# Patient Record
Sex: Male | Born: 1995
Health system: Southern US, Community
[De-identification: ages and names within clinical notes are randomized; demographics above are authoritative.]

## PROBLEM LIST (undated history)

## (undated) HISTORY — PX: TONSILLECTOMY: SUR1361

## (undated) HISTORY — PX: WISDOM TOOTH EXTRACTION: SHX21

---
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
## 2015-08-20 ENCOUNTER — Emergency Department (HOSPITAL_COMMUNITY)
Admission: EM | Admit: 2015-08-20 | Discharge: 2015-08-20 | Disposition: A | Discharge status: Home / Self Care | Payer: Medicaid Other | Attending: Emergency Medicine | Admitting: Emergency Medicine

## 2015-08-20 ENCOUNTER — Encounter (HOSPITAL_COMMUNITY): Payer: Self-pay | Admitting: Emergency Medicine

## 2015-08-20 DIAGNOSIS — R5383 Other fatigue: Secondary | ICD-10-CM | POA: Insufficient documentation

## 2015-08-20 DIAGNOSIS — Z91040 Latex allergy status: Secondary | ICD-10-CM | POA: Insufficient documentation

## 2015-08-20 DIAGNOSIS — L03115 Cellulitis of right lower limb: Secondary | ICD-10-CM

## 2015-08-20 LAB — COMPREHENSIVE METABOLIC PANEL
ALT: 21 U/L (ref 17–63)
ANION GAP: 8 (ref 5–15)
AST: 22 U/L (ref 15–41)
Albumin: 4.6 g/dL (ref 3.5–5.0)
Alkaline Phosphatase: 68 U/L (ref 38–126)
BILIRUBIN TOTAL: 0.6 mg/dL (ref 0.3–1.2)
BUN: 12 mg/dL (ref 6–20)
CHLORIDE: 104 mmol/L (ref 101–111)
CO2: 29 mmol/L (ref 22–32)
Calcium: 9.6 mg/dL (ref 8.9–10.3)
Creatinine, Ser: 0.88 mg/dL (ref 0.61–1.24)
GFR calc Af Amer: >60 mL/min (ref >60)
Glucose, Bld: 95 mg/dL (ref 65–99)
POTASSIUM: 3.8 mmol/L (ref 3.5–5.1)
Sodium: 141 mmol/L (ref 135–145)
TOTAL PROTEIN: 7.5 g/dL (ref 6.5–8.1)

## 2015-08-20 LAB — CBC WITH DIFFERENTIAL/PLATELET
BASOS ABS: 0 10*3/uL (ref 0.0–0.1)
Basophils Relative: 0 %
EOS PCT: 3 %
Eosinophils Absolute: 0.2 10*3/uL (ref 0.0–0.7)
HEMATOCRIT: 40.6 % (ref 39.0–52.0)
Hemoglobin: 14.4 g/dL (ref 13.0–17.0)
LYMPHS PCT: 34 %
Lymphs Abs: 2.3 10*3/uL (ref 0.7–4.0)
MCH: 29.9 pg (ref 26.0–34.0)
MCHC: 35.5 g/dL (ref 30.0–36.0)
MCV: 84.2 fL (ref 78.0–100.0)
Monocytes Absolute: 0.5 10*3/uL (ref 0.1–1.0)
Monocytes Relative: 7 %
NEUTROS PCT: 56 %
Neutro Abs: 3.8 10*3/uL (ref 1.7–7.7)
PLATELETS: 167 10*3/uL (ref 150–400)
RBC: 4.82 MIL/uL (ref 4.22–5.81)
RDW: 12.4 % (ref 11.5–15.5)
WBC: 6.8 10*3/uL (ref 4.0–10.5)

## 2015-08-20 MED ORDER — DOXYCYCLINE HYCLATE 100 MG PO CAPS: 100.0000 mg | ORAL_CAPSULE | Freq: Two times a day (BID) | ORAL | Status: DC

## 2015-08-20 NOTE — ED Provider Notes (Signed)
CSN: [id number]     Arrival date & time 08/20/15  1549 History   First MD Initiated Contact with Patient 08/20/15 1600     Chief Complaint  Patient presents with  . Insect Bite    tick bite     (Consider location/radiation/quality/duration/timing/severity/associated sxs/prior Treatment) HPI... Allegedly tick bite on right inner thigh approximately one month ago resulting in skin rash followed by generalized fatigue, lethargy, low energy.  Patient and his girlfriend think he may have  Lyme disease.  No chronic health problems. No medications. No smoking no drinking.  History reviewed. No pertinent past medical history. Past Surgical History  Procedure Laterality Date  . Tonsillectomy    . Wisdom tooth extraction     No family history on file. Social History  Substance Use Topics  . Smoking status: Never Smoker   . Smokeless tobacco: None  . Alcohol Use: No    Review of Systems  All other systems reviewed and are negative.     Allergies  Latex  Home Medications   Prior to Admission medications   Medication Sig Start Date End Date Taking? Authorizing Provider  doxycycline (VIBRAMYCIN) 100 MG capsule Take 1 capsule (100 mg total) by mouth 2 (two) times daily. 08/20/15   Donnetta HutchingBrian Hema Lanza, MD   BP 121/56 mmHg  Pulse 65  Temp(Src) 98.4 F (36.9 C) (Oral)  Resp 18  Ht 5\' 10"  (1.778 m)  Wt 200 lb (90.719 kg)  BMI 28.70 kg/m2  SpO2 100% Physical Exam  Constitutional: He is oriented to person, place, and time. He appears well-developed and well-nourished.  HENT:  Head: Normocephalic and atraumatic.  Eyes: Conjunctivae and EOM are normal. Pupils are equal, round, and reactive to light.  Neck: Normal range of motion. Neck supple.  Cardiovascular: Normal rate and regular rhythm.   Pulmonary/Chest: Effort normal and breath sounds normal.  Abdominal: Soft. Bowel sounds are normal.  Musculoskeletal: Normal range of motion.  Neurological: He is alert and oriented to person, place,  and time.  Skin:  Small area of erythema right medial thigh  Psychiatric: He has a normal mood and affect. His behavior is normal.  Nursing note and vitals reviewed.   ED Course  Procedures (including critical care time) Labs Review Labs Reviewed  CBC WITH DIFFERENTIAL/PLATELET  COMPREHENSIVE METABOLIC PANEL  URINALYSIS, ROUTINE W REFLEX MICROSCOPIC (NOT AT [facility])  B. BURGDORFI ANTIBODIES    Imaging Review No results found. I have personally reviewed and evaluated these images and lab results as part of my medical decision-making.   EKG Interpretation None      MDM   Final diagnoses:  Cellulitis of right lower extremity    Girlfriend discussed what appeared to be a target lesion approximately 1 month ago. Will treat with doxycycline 100 mg twice a day. Lyme titer pending.    Donnetta HutchingBrian Khamya Topp, MD 08/20/15 [phone]

## 2015-08-20 NOTE — ED Notes (Signed)
Patient verbalizes understanding of discharge instructions, prescription medications, home care and follow up care. Patient ambulatory out of department at this time with family member. 

## 2015-08-20 NOTE — ED Notes (Signed)
Pt reports being bit by a tick to RT upper, inner thigh. Pt reports that it looked like a bullseye. Pt reports nausea, vomiting, and fever. States symptoms have become increasingly worse. Pt's girlfriend states he has been having dizzy spells. Pt AOx4

## 2015-08-20 NOTE — ED Notes (Signed)
Pt was given Ginger Ale 

## 2015-08-20 NOTE — Discharge Instructions (Signed)
Blood work was normal. Prescription for antibiotic. Lyme test pending

## 2015-08-21 LAB — B. BURGDORFI ANTIBODIES: B burgdorferi Ab IgG+IgM: <0.91 {ISR} (ref 0.00–0.90)

## 2019-06-09 ENCOUNTER — Other Ambulatory Visit: Payer: Self-pay

## 2019-06-09 DIAGNOSIS — Z20822 Contact with and (suspected) exposure to covid-19: Secondary | ICD-10-CM

## 2019-06-10 ENCOUNTER — Ambulatory Visit (INDEPENDENT_AMBULATORY_CARE_PROVIDER_SITE_OTHER): Payer: Self-pay | Admitting: Nurse Practitioner

## 2019-06-10 ENCOUNTER — Telehealth: Payer: Self-pay | Admitting: General Practice

## 2019-06-10 LAB — NOVEL CORONAVIRUS, NAA: SARS-CoV-2, NAA: NOT DETECTED

## 2019-06-10 NOTE — Telephone Encounter (Signed)
Negative COVID results given. Patient results "NOT Detected." Caller expressed understanding. ° °

## 2019-06-15 ENCOUNTER — Emergency Department (HOSPITAL_COMMUNITY): Payer: Self-pay

## 2019-06-15 ENCOUNTER — Emergency Department (HOSPITAL_COMMUNITY)
Admission: EM | Admit: 2019-06-15 | Discharge: 2019-06-15 | Disposition: A | Discharge status: Home / Self Care | Payer: Self-pay | Attending: Emergency Medicine | Admitting: Emergency Medicine

## 2019-06-15 DIAGNOSIS — Z91040 Latex allergy status: Secondary | ICD-10-CM | POA: Insufficient documentation

## 2019-06-15 DIAGNOSIS — N200 Calculus of kidney: Secondary | ICD-10-CM | POA: Insufficient documentation

## 2019-06-15 LAB — URINALYSIS, ROUTINE W REFLEX MICROSCOPIC
Bacteria, UA: NONE SEEN
Bilirubin Urine: NEGATIVE
Glucose, UA: NEGATIVE mg/dL
Ketones, ur: NEGATIVE mg/dL
Leukocytes,Ua: NEGATIVE
Nitrite: NEGATIVE
Protein, ur: 30 mg/dL — AB
RBC / HPF: >50 RBC/hpf — ABNORMAL HIGH (ref 0–5)
Specific Gravity, Urine: 1.028 (ref 1.005–1.030)
pH: 5 (ref 5.0–8.0)

## 2019-06-15 LAB — CBC WITH DIFFERENTIAL/PLATELET
Abs Immature Granulocytes: 0.02 10*3/uL (ref 0.00–0.07)
Basophils Absolute: 0.1 10*3/uL (ref 0.0–0.1)
Basophils Relative: 1 %
Eosinophils Absolute: 0.1 10*3/uL (ref 0.0–0.5)
Eosinophils Relative: 2 %
HCT: 44.1 % (ref 39.0–52.0)
Hemoglobin: 15.2 g/dL (ref 13.0–17.0)
Immature Granulocytes: 0 %
Lymphocytes Relative: 28 %
Lymphs Abs: 2 10*3/uL (ref 0.7–4.0)
MCH: 29.7 pg (ref 26.0–34.0)
MCHC: 34.5 g/dL (ref 30.0–36.0)
MCV: 86.3 fL (ref 80.0–100.0)
Monocytes Absolute: 0.6 10*3/uL (ref 0.1–1.0)
Monocytes Relative: 8 %
Neutro Abs: 4.3 10*3/uL (ref 1.7–7.7)
Neutrophils Relative %: 61 %
Platelets: 204 10*3/uL (ref 150–400)
RBC: 5.11 MIL/uL (ref 4.22–5.81)
RDW: 12.2 % (ref 11.5–15.5)
WBC: 7 10*3/uL (ref 4.0–10.5)
nRBC: 0 % (ref 0.0–0.2)

## 2019-06-15 LAB — COMPREHENSIVE METABOLIC PANEL
ALT: 43 U/L (ref 0–44)
AST: 28 U/L (ref 15–41)
Albumin: 4.7 g/dL (ref 3.5–5.0)
Alkaline Phosphatase: 55 U/L (ref 38–126)
Anion gap: 13 (ref 5–15)
BUN: 13 mg/dL (ref 6–20)
CO2: 27 mmol/L (ref 22–32)
Calcium: 9.5 mg/dL (ref 8.9–10.3)
Chloride: 100 mmol/L (ref 98–111)
Creatinine, Ser: 1.12 mg/dL (ref 0.61–1.24)
GFR calc Af Amer: >60 mL/min (ref >60)
GFR calc non Af Amer: >60 mL/min (ref >60)
Glucose, Bld: 114 mg/dL — ABNORMAL HIGH (ref 70–99)
Potassium: 3.7 mmol/L (ref 3.5–5.1)
Sodium: 140 mmol/L (ref 135–145)
Total Bilirubin: 0.6 mg/dL (ref 0.3–1.2)
Total Protein: 7.5 g/dL (ref 6.5–8.1)

## 2019-06-15 LAB — LIPASE, BLOOD: Lipase: 33 U/L (ref 11–51)

## 2019-06-15 MED ORDER — HYDROCODONE-ACETAMINOPHEN 5-325 MG PO TABS: 1.0000 | ORAL_TABLET | Freq: Four times a day (QID) | ORAL | 0 refills | Status: AC | PRN

## 2019-06-15 MED ORDER — KETOROLAC TROMETHAMINE 30 MG/ML IJ SOLN
30.0000 mg | Freq: Once | INTRAMUSCULAR | Status: AC
  Administered 2019-06-15: 14:00:00 30 mg via INTRAVENOUS
  Filled 2019-06-15: qty 1

## 2019-06-15 MED ORDER — KETOROLAC TROMETHAMINE 30 MG/ML IJ SOLN
15.0000 mg | Freq: Once | INTRAMUSCULAR | Status: AC
  Administered 2019-06-15: 17:00:00 15 mg via INTRAVENOUS
  Filled 2019-06-15: qty 1

## 2019-06-15 MED ORDER — ONDANSETRON 4 MG PO TBDP: 4.0000 mg | ORAL_TABLET | Freq: Three times a day (TID) | ORAL | 0 refills | Status: AC | PRN

## 2019-06-15 MED ORDER — ONDANSETRON HCL 4 MG/2ML IJ SOLN
4.0000 mg | Freq: Once | INTRAMUSCULAR | Status: AC
  Administered 2019-06-15: 15:00:00 4 mg via INTRAVENOUS
  Filled 2019-06-15: qty 2

## 2019-06-15 MED ORDER — TAMSULOSIN HCL 0.4 MG PO CAPS: 0.4000 mg | ORAL_CAPSULE | Freq: Every day | ORAL | 0 refills | Status: AC

## 2019-06-15 MED ORDER — SODIUM CHLORIDE 0.9 % IV BOLUS
1000.0000 mL | Freq: Once | INTRAVENOUS | Status: AC
  Administered 2019-06-15: 1000 mL via INTRAVENOUS

## 2019-06-15 NOTE — ED Provider Notes (Signed)
[facility] EMERGENCY DEPARTMENT Provider Note   CSN: [id number] Arrival date & time: 06/15/19  1332     History   Chief Complaint Chief Complaint  Patient presents with   Abdominal Pain    HPI Robert Rios is a 23 y.o. male.     The history is provided by the patient.    No past medical history on file.  Brought in by EMS for evaluation of right-sided abdominal pain.  He states that he went to [place] to go get lunch and all of a sudden developed right lower abdominal pain.  He describes it as sharp and states that it is 10/10.  He had an episode of nonbloody, nonbilious vomiting in route.  He states that he was in his normal state state of health today prior to onset of symptoms.  He ate breakfast without any difficulty and has not been sick over the last several days.  He has not noted any fevers, chest pain, difficulty breathing.  He states a couple weeks ago, he did have some blood in his urine.  He did a telehealth visit and they diagnosed him with Salmonella.  Patient states he has not had any more hematuria or dysuria.   Patient Active Problem List   Diagnosis Date Noted   SPONDYLOLYSIS 03/25/2008   LOW BACK PAIN 03/11/2008   LUMBAR SPASM 03/11/2008   SUBLUXATION-RADIAL HEAD 03/11/2008    Past Surgical History:  Procedure Laterality Date   TONSILLECTOMY     WISDOM TOOTH EXTRACTION          Home Medications    Prior to Admission medications   Medication Sig Start Date End Date Taking? Authorizing Provider  doxycycline (VIBRAMYCIN) 100 MG capsule Take 1 capsule (100 mg total) by mouth 2 (two) times daily. 08/20/15   Nat Christen, MD  HYDROcodone-acetaminophen (NORCO/VICODIN) 5-325 MG tablet Take 1-2 tablets by mouth every 6 (six) hours as needed. 06/15/19   Volanda Napoleon, PA-C  ondansetron (ZOFRAN ODT) 4 MG disintegrating tablet Take 1 tablet (4 mg total) by mouth every 8 (eight) hours as needed for nausea or vomiting. 06/15/19   Volanda Napoleon, PA-C  tamsulosin (FLOMAX) 0.4 MG CAPS capsule Take 1 capsule (0.4 mg total) by mouth daily. 06/15/19   Volanda Napoleon, PA-C    Family History No family history on file.  Social History Social History   Tobacco Use   Smoking status: Never Smoker  Substance Use Topics   Alcohol use: No   Drug use: No     Allergies   Latex   Review of Systems Review of Systems  Constitutional: Negative for fever.  Respiratory: Negative for cough and shortness of breath.   Cardiovascular: Negative for chest pain.  Gastrointestinal: Positive for abdominal pain, nausea and vomiting.  Genitourinary: Negative for dysuria and hematuria.  Neurological: Negative for headaches.  All other systems reviewed and are negative.    Physical Exam Updated Vital Signs BP 118/76    Pulse 76    Temp 98 F (36.7 C)    Resp 18    Ht 5\' 11"  (1.803 m)    Wt 104.3 kg    SpO2 100%    BMI 32.08 kg/m   Physical Exam Vitals signs and nursing note reviewed.  Constitutional:      Appearance: Normal appearance. He is well-developed.     Comments: Appears uncomfortable but no acute distress  HENT:     Head: Normocephalic and atraumatic.  Eyes:     General: Lids are normal.     Conjunctiva/sclera: Conjunctivae normal.     Pupils: Pupils are equal, round, and reactive to light.  Neck:     Musculoskeletal: Full passive range of motion without pain.  Cardiovascular:     Rate and Rhythm: Normal rate and regular rhythm.     Pulses: Normal pulses.     Heart sounds: Normal heart sounds. No murmur. No friction rub. No gallop.   Pulmonary:     Effort: Pulmonary effort is normal.     Breath sounds: Normal breath sounds.     Comments: Lungs clear to auscultation bilaterally.  Symmetric chest rise.  No wheezing, rales, rhonchi. Abdominal:     Palpations: Abdomen is soft. Abdomen is not rigid.     Tenderness: There is no abdominal tenderness. There is no guarding.       Comments: Abdomen is soft,  nondistended.  Diffuse tenderness noted to the right lower quadrant and mid right lower abdomen.  He does have some tenderness noted at McBurney's point.  No CVA tenderness.  No rigidity, guarding.  Musculoskeletal: Normal range of motion.  Skin:    General: Skin is warm and dry.     Capillary Refill: Capillary refill takes less than 2 seconds.  Neurological:     Mental Status: He is alert and oriented to person, place, and time.  Psychiatric:        Speech: Speech normal.      ED Treatments / Results  Labs (all labs ordered are listed, but only abnormal results are displayed) Labs Reviewed  COMPREHENSIVE METABOLIC PANEL - Abnormal; Notable for the following components:      Result Value   Glucose, Bld 114 (*)    All other components within normal limits  URINALYSIS, ROUTINE W REFLEX MICROSCOPIC - Abnormal; Notable for the following components:   APPearance CLOUDY (*)    Hgb urine dipstick LARGE (*)    Protein, ur 30 (*)    RBC / HPF >50 (*)    All other components within normal limits  CBC WITH DIFFERENTIAL/PLATELET  LIPASE, BLOOD    EKG None  Radiology Ct Renal Stone Study  Result Date: 06/15/2019 CLINICAL DATA:  Right lower quadrant pain. EXAM: CT ABDOMEN AND PELVIS WITHOUT CONTRAST TECHNIQUE: Multidetector CT imaging of the abdomen and pelvis was performed following the standard protocol without IV contrast. COMPARISON:  None. FINDINGS: Lower chest: No acute abnormality. Hepatobiliary: No focal liver abnormality is seen. No gallstones, gallbladder wall thickening, or biliary dilatation. Pancreas: Unremarkable. No pancreatic ductal dilatation or surrounding inflammatory changes. Spleen: Normal in size without focal abnormality. Adrenals/Urinary Tract: The left adrenal gland is normal. There is rounded calcification in the right adrenal gland measuring up to 13 mm. Right adrenal glands otherwise normal. No renal stones identified. The left kidney and ureter are normal. There is  mild hydronephrosis on the left and mild right ureterectasis due to a stone in the distal right ureter measuring 3.3 mm. The bladder is decompressed but unremarkable. Stomach/Bowel: Stomach is within normal limits. Appendix appears normal. No evidence of bowel wall thickening, distention, or inflammatory changes. Vascular/Lymphatic: No significant vascular findings are present. No enlarged abdominal or pelvic lymph nodes. Reproductive: Prostate is unremarkable. Other: No abdominal wall hernia or abnormality. No abdominopelvic ascites. Musculoskeletal: No acute or significant osseous findings. IMPRESSION: 1. Mild right hydronephrosis and ureterectasis due to a 3.3 mm stone in the distal right ureter. 2. Rounded calcification in the right adrenal  gland is likely a site of previous hemorrhage or infection and of doubtful significance. 3. No other abnormalities. Electronically Signed   By: Gerome Samavid  Williams III M.D   On: 06/15/2019 16:23    Procedures Procedures (including critical care time)  Medications Ordered in ED Medications  ketorolac (TORADOL) 30 MG/ML injection 30 mg (30 mg Intravenous Given 06/15/19 1401)  ondansetron (ZOFRAN) injection 4 mg (4 mg Intravenous Given 06/15/19 1445)  sodium chloride 0.9 % bolus 1,000 mL (0 mLs Intravenous Stopped 06/15/19 1741)  ketorolac (TORADOL) 30 MG/ML injection 15 mg (15 mg Intravenous Given 06/15/19 1640)     Initial Impression / Assessment and Plan / ED Course  I have reviewed the triage vital signs and the nursing notes.  Pertinent labs & imaging results that were available during my care of the patient were reviewed by me and considered in my medical decision making (see chart for details).        23 year old male who presents for evaluation of acute onset right lower quadrant abdominal pain.  Associated with vomiting.  No recent fevers, nausea.  No chest pain, difficulty breathing.  On initial ED arrival, he is afebrile.  He appears uncomfortable but  no acute distress.  Vitals otherwise stable.  Does have diffuse tenderness noted to right lower and mid abdomen but does have some tenderness in this point.  Consider kidney stone versus appendicitis.  CBC without any significant leukocytosis or anemia.  Lipase is within normal limits.  CMP is unremarkable. UA shows large hemoglobin. Given findings, will proceed with renal study.   CT scan shows a 3 mm kidney stone noted in the right side.  Mild right hydronephrosis associated.  Also rounded calcification right adrenal gland that is doubtful significance.  Discussed results with patient.  Patient reports feeling better after analgesics.  Vitals are stable.  Repeat abdominal exam shows improved tenderness.  Patient states he is ready to go home. At this time, patient exhibits no emergent life-threatening condition that require further evaluation in ED or admission. Patient had ample opportunity for questions and discussion. All patient's questions were answered with full understanding. Strict return precautions discussed. Patient expresses understanding and agreement to plan.   Portions of this note were generated with Scientist, clinical (histocompatibility and immunogenetics)Dragon dictation software. Dictation errors may occur despite best attempts at proofreading.   Final Clinical Impressions(s) / ED Diagnoses   Final diagnoses:  Kidney stone on right side    ED Discharge Orders         Ordered    HYDROcodone-acetaminophen (NORCO/VICODIN) 5-325 MG tablet  Every 6 hours PRN     06/15/19 1740    ondansetron (ZOFRAN ODT) 4 MG disintegrating tablet  Every 8 hours PRN     06/15/19 1740    tamsulosin (FLOMAX) 0.4 MG CAPS capsule  Daily     06/15/19 1740           Rosana HoesLayden, Kellen Hover A, PA-C 06/15/19 1934    Tegeler, Canary Brimhristopher J, MD 06/15/19 2002

## 2019-06-15 NOTE — ED Triage Notes (Addendum)
Pt BIB [facility] from work. Per EMS patient was at work about to eat lunch when he developed sudden onset right, lower quadrant pain. 10/10 sharp pain. Reports patient tried to go to the restroom but was unable to. Pt has had one episode of vomiting since the pain onset. Pt reports feeling fine prior to his onset of pain.

## 2019-06-15 NOTE — Discharge Instructions (Signed)
You can take Tylenol or Ibuprofen as directed for pain. You can alternate Tylenol and Ibuprofen every 4 hours. If you take Tylenol at 1pm, then you can take Ibuprofen at 5pm. Then you can take Tylenol again at 9pm.   Take pain medications as directed for break through pain. Do not drive or operate machinery while taking this medication.   Take Flomax as directed.  Take Zofran for nausea.  As we discussed, follow-up with referred to urology clinic.  Return the emergency department for any worsening pain not controlled by medication, vomiting, fevers or any other worsening concerning symptoms.

## 2019-06-25 DIAGNOSIS — F438 Other reactions to severe stress: Secondary | ICD-10-CM | POA: Diagnosis not present

## 2019-06-30 ENCOUNTER — Ambulatory Visit (HOSPITAL_COMMUNITY): Payer: Self-pay

## 2019-06-30 ENCOUNTER — Other Ambulatory Visit: Payer: Self-pay

## 2019-06-30 ENCOUNTER — Other Ambulatory Visit: Payer: Self-pay | Admitting: Urology

## 2019-06-30 ENCOUNTER — Ambulatory Visit (HOSPITAL_COMMUNITY): Payer: Self-pay | Admitting: Certified Registered"

## 2019-06-30 ENCOUNTER — Ambulatory Visit (HOSPITAL_COMMUNITY)
Admission: RE | Admit: 2019-06-30 | Discharge: 2019-06-30 | Disposition: A | Discharge status: Home / Self Care | Payer: Self-pay | Source: Other Acute Inpatient Hospital | Attending: Urology | Admitting: Urology

## 2019-06-30 ENCOUNTER — Encounter (HOSPITAL_COMMUNITY)
Admission: RE | Disposition: A | Discharge status: Home / Self Care | Payer: Self-pay | Source: Other Acute Inpatient Hospital | Attending: Urology

## 2019-06-30 ENCOUNTER — Other Ambulatory Visit (HOSPITAL_COMMUNITY)
Admission: RE | Admit: 2019-06-30 | Discharge: 2019-06-30 | Disposition: A | Discharge status: Home / Self Care | Payer: HRSA Program | Source: Ambulatory Visit | Attending: Urology | Admitting: Urology

## 2019-06-30 ENCOUNTER — Encounter (HOSPITAL_COMMUNITY): Payer: Self-pay | Admitting: Anesthesiology

## 2019-06-30 DIAGNOSIS — Z20828 Contact with and (suspected) exposure to other viral communicable diseases: Secondary | ICD-10-CM | POA: Insufficient documentation

## 2019-06-30 DIAGNOSIS — Z79899 Other long term (current) drug therapy: Secondary | ICD-10-CM | POA: Insufficient documentation

## 2019-06-30 DIAGNOSIS — N201 Calculus of ureter: Secondary | ICD-10-CM | POA: Diagnosis not present

## 2019-06-30 DIAGNOSIS — N135 Crossing vessel and stricture of ureter without hydronephrosis: Secondary | ICD-10-CM | POA: Diagnosis not present

## 2019-06-30 DIAGNOSIS — N132 Hydronephrosis with renal and ureteral calculous obstruction: Secondary | ICD-10-CM | POA: Insufficient documentation

## 2019-06-30 DIAGNOSIS — Z01812 Encounter for preprocedural laboratory examination: Secondary | ICD-10-CM | POA: Diagnosis not present

## 2019-06-30 DIAGNOSIS — N131 Hydronephrosis with ureteral stricture, not elsewhere classified: Secondary | ICD-10-CM | POA: Insufficient documentation

## 2019-06-30 DIAGNOSIS — F419 Anxiety disorder, unspecified: Secondary | ICD-10-CM | POA: Insufficient documentation

## 2019-06-30 DIAGNOSIS — F329 Major depressive disorder, single episode, unspecified: Secondary | ICD-10-CM | POA: Insufficient documentation

## 2019-06-30 HISTORY — PX: CYSTOSCOPY WITH RETROGRADE PYELOGRAM, URETEROSCOPY AND STENT PLACEMENT: SHX5789

## 2019-06-30 LAB — SARS CORONAVIRUS 2 BY RT PCR (HOSPITAL ORDER, PERFORMED IN ~~LOC~~ HOSPITAL LAB): SARS Coronavirus 2: NEGATIVE

## 2019-06-30 SURGERY — CYSTOURETEROSCOPY, WITH RETROGRADE PYELOGRAM AND STENT INSERTION
Anesthesia: General | Laterality: Right

## 2019-06-30 MED ORDER — HYDROMORPHONE HCL 1 MG/ML IJ SOLN: 0.2500 mg | INTRAMUSCULAR | Status: DC | PRN

## 2019-06-30 MED ORDER — SODIUM CHLORIDE 0.9 % IV SOLN: 250.0000 mL | INTRAVENOUS | Status: DC | PRN

## 2019-06-30 MED ORDER — ONDANSETRON HCL 4 MG/2ML IJ SOLN
INTRAMUSCULAR | Status: AC
  Filled 2019-06-30: qty 2

## 2019-06-30 MED ORDER — LACTATED RINGERS IV SOLN
INTRAVENOUS | Status: DC
  Administered 2019-06-30: 15:00:00 via INTRAVENOUS

## 2019-06-30 MED ORDER — CEFAZOLIN SODIUM-DEXTROSE 2-4 GM/100ML-% IV SOLN
2.0000 g | INTRAVENOUS | Status: AC
  Administered 2019-06-30: 17:00:00 2 g via INTRAVENOUS
  Filled 2019-06-30: qty 100

## 2019-06-30 MED ORDER — SODIUM CHLORIDE 0.9 % IR SOLN
Status: DC | PRN
  Administered 2019-06-30: 3000 mL via INTRAVESICAL

## 2019-06-30 MED ORDER — ONDANSETRON HCL 4 MG/2ML IJ SOLN
INTRAMUSCULAR | Status: DC | PRN
  Administered 2019-06-30: 4 mg via INTRAVENOUS

## 2019-06-30 MED ORDER — KETOROLAC TROMETHAMINE 15 MG/ML IJ SOLN
INTRAMUSCULAR | Status: DC | PRN
  Administered 2019-06-30: 15 mg via INTRAVENOUS

## 2019-06-30 MED ORDER — MEPERIDINE HCL 50 MG/ML IJ SOLN: 6.2500 mg | INTRAMUSCULAR | Status: DC | PRN

## 2019-06-30 MED ORDER — FENTANYL CITRATE (PF) 100 MCG/2ML IJ SOLN
INTRAMUSCULAR | Status: DC | PRN
  Administered 2019-06-30 (×2): 50 ug via INTRAVENOUS

## 2019-06-30 MED ORDER — PHENAZOPYRIDINE HCL 200 MG PO TABS: 200.0000 mg | ORAL_TABLET | Freq: Three times a day (TID) | ORAL | 0 refills | Status: AC | PRN

## 2019-06-30 MED ORDER — OXYCODONE HCL 5 MG PO TABS: 5.0000 mg | ORAL_TABLET | ORAL | Status: DC | PRN

## 2019-06-30 MED ORDER — MORPHINE SULFATE (PF) 4 MG/ML IV SOLN: 2.0000 mg | INTRAVENOUS | Status: DC | PRN

## 2019-06-30 MED ORDER — PROPOFOL 10 MG/ML IV BOLUS
INTRAVENOUS | Status: AC
  Filled 2019-06-30: qty 20

## 2019-06-30 MED ORDER — PROMETHAZINE HCL 25 MG/ML IJ SOLN: 6.2500 mg | INTRAMUSCULAR | Status: DC | PRN

## 2019-06-30 MED ORDER — MIDAZOLAM HCL 2 MG/2ML IJ SOLN
INTRAMUSCULAR | Status: AC
  Filled 2019-06-30: qty 2

## 2019-06-30 MED ORDER — SODIUM CHLORIDE 0.9% FLUSH: 3.0000 mL | INTRAVENOUS | Status: DC | PRN

## 2019-06-30 MED ORDER — LIDOCAINE 2% (20 MG/ML) 5 ML SYRINGE
INTRAMUSCULAR | Status: AC
  Filled 2019-06-30: qty 5

## 2019-06-30 MED ORDER — LIDOCAINE 2% (20 MG/ML) 5 ML SYRINGE
INTRAMUSCULAR | Status: DC | PRN
  Administered 2019-06-30: 100 mg via INTRAVENOUS

## 2019-06-30 MED ORDER — ACETAMINOPHEN 650 MG RE SUPP
650.0000 mg | RECTAL | Status: DC | PRN
  Filled 2019-06-30: qty 1

## 2019-06-30 MED ORDER — ACETAMINOPHEN 325 MG PO TABS: 650.0000 mg | ORAL_TABLET | ORAL | Status: DC | PRN

## 2019-06-30 MED ORDER — PROPOFOL 10 MG/ML IV BOLUS
INTRAVENOUS | Status: DC | PRN
  Administered 2019-06-30: 200 mg via INTRAVENOUS

## 2019-06-30 MED ORDER — DEXAMETHASONE SODIUM PHOSPHATE 10 MG/ML IJ SOLN
INTRAMUSCULAR | Status: DC | PRN
  Administered 2019-06-30: 10 mg via INTRAVENOUS

## 2019-06-30 MED ORDER — FENTANYL CITRATE (PF) 100 MCG/2ML IJ SOLN
INTRAMUSCULAR | Status: AC
  Filled 2019-06-30: qty 2

## 2019-06-30 MED ORDER — MIDAZOLAM HCL 5 MG/5ML IJ SOLN
INTRAMUSCULAR | Status: DC | PRN
  Administered 2019-06-30: 2 mg via INTRAVENOUS

## 2019-06-30 SURGICAL SUPPLY — 26 items
BAG URO CATCHER STRL LF (MISCELLANEOUS) ×3 IMPLANT
BASKET STONE NCOMPASS (UROLOGICAL SUPPLIES) IMPLANT
CATH URET 5FR 28IN OPEN ENDED (CATHETERS) IMPLANT
CATH URET DUAL LUMEN 6-10FR 50 (CATHETERS) ×1 IMPLANT
CLOTH BEACON ORANGE TIMEOUT ST (SAFETY) ×3 IMPLANT
COVER WAND RF STERILE (DRAPES) IMPLANT
EXTRACTOR STONE NITINOL NGAGE (UROLOGICAL SUPPLIES) ×3 IMPLANT
FIBER LASER FLEXIVA 1000 (UROLOGICAL SUPPLIES) IMPLANT
FIBER LASER FLEXIVA 365 (UROLOGICAL SUPPLIES) IMPLANT
FIBER LASER FLEXIVA 550 (UROLOGICAL SUPPLIES) IMPLANT
FIBER LASER TRAC TIP (UROLOGICAL SUPPLIES) IMPLANT
GLOVE SURG SS PI 8.0 STRL IVOR (GLOVE) IMPLANT
GOWN STRL REUS W/TWL XL LVL3 (GOWN DISPOSABLE) ×5 IMPLANT
GUIDEWIRE STR DUAL SENSOR (WIRE) ×3 IMPLANT
IV NS 1000ML (IV SOLUTION) ×3
IV NS 1000ML BAXH (IV SOLUTION) ×1 IMPLANT
KIT BALLN UROMAX 15FX4 (MISCELLANEOUS) IMPLANT
KIT BALLN UROMAX 26 75X4 (MISCELLANEOUS) ×2
KIT TURNOVER KIT A (KITS) ×2 IMPLANT
MANIFOLD NEPTUNE II (INSTRUMENTS) ×3 IMPLANT
PACK CYSTO (CUSTOM PROCEDURE TRAY) ×3 IMPLANT
SHEATH URETERAL 12FRX35CM (MISCELLANEOUS) ×1 IMPLANT
STENT CONTOUR 6FRX26X.038 (STENTS) ×2 IMPLANT
TUBING CONNECTING 10 (TUBING) ×2 IMPLANT
TUBING CONNECTING 10' (TUBING) ×1
TUBING UROLOGY SET (TUBING) ×3 IMPLANT

## 2019-06-30 NOTE — Anesthesia Preprocedure Evaluation (Signed)
Anesthesia Evaluation  Patient identified by MRN, date of birth, ID band Patient awake    Reviewed: Allergy & Precautions, NPO status , Patient's Chart, lab work & pertinent test results  Airway Mallampati: II  TM Distance: >3 FB Neck ROM: Full    Dental no notable dental hx. (+) Teeth Intact   Pulmonary neg pulmonary ROS,    Pulmonary exam normal breath sounds clear to auscultation       Cardiovascular negative cardio ROS Normal cardiovascular exam Rhythm:Regular Rate:Normal     Neuro/Psych negative neurological ROS  negative psych ROS   GI/Hepatic negative GI ROS, Neg liver ROS,   Endo/Other  negative endocrine ROS  Renal/GU Right distal ureteral calculus  negative genitourinary   Musculoskeletal negative musculoskeletal ROS (+)   Abdominal   Peds  Hematology negative hematology ROS (+)   Anesthesia Other Findings   Reproductive/Obstetrics                             Anesthesia Physical Anesthesia Plan  ASA: II  Anesthesia Plan: General   Post-op Pain Management:    Induction: Intravenous  PONV Risk Score and Plan: 4 or greater and Midazolam, Ondansetron, Dexamethasone and Treatment may vary due to age or medical condition  Airway Management Planned: LMA  Additional Equipment:   Intra-op Plan:   Post-operative Plan: Extubation in OR  Informed Consent: I have reviewed the patients History and Physical, chart, labs and discussed the procedure including the risks, benefits and alternatives for the proposed anesthesia with the patient or authorized representative who has indicated his/her understanding and acceptance.     Dental advisory given  Plan Discussed with: CRNA and Surgeon  Anesthesia Plan Comments:         Anesthesia Quick Evaluation

## 2019-06-30 NOTE — Transfer of Care (Signed)
Immediate Anesthesia Transfer of Care Note  Patient: Robert Rios  Procedure(s) Performed: CYSTOSCOPY WITH RETROGRADE PYELOGRAM,RIGHT URETEROSCOPY, STENT PLACEMENT, BASKET STONE REMOVAL (Right )  Patient Location: PACU  Anesthesia Type:General  Level of Consciousness: awake, alert  and patient cooperative  Airway & Oxygen Therapy: Patient Spontanous Breathing and Patient connected to face mask oxygen  Post-op Assessment: Report given to RN and Post -op Vital signs reviewed and stable  Post vital signs: Reviewed and stable  Last Vitals:  Vitals Value Taken Time  BP 135/73 06/30/19 1716  Temp    Pulse 93 06/30/19 1718  Resp 20 06/30/19 1718  SpO2 100 % 06/30/19 1718  Vitals shown include unvalidated device data.  Last Pain:  Vitals:   06/30/19 1506  TempSrc:   PainSc: 1       Patients Stated Pain Goal: 3 (37/09/64 3838)  Complications: No apparent anesthesia complications

## 2019-06-30 NOTE — Anesthesia Postprocedure Evaluation (Signed)
Anesthesia Post Note  Patient: [name]  Procedure(s) Performed: CYSTOSCOPY WITH RETROGRADE PYELOGRAM,RIGHT URETEROSCOPY, STENT PLACEMENT, BASKET STONE REMOVAL (Right )     Patient location during evaluation: PACU Anesthesia Type: General Level of consciousness: awake and alert and oriented Pain management: pain level controlled Vital Signs Assessment: post-procedure vital signs reviewed and stable Respiratory status: spontaneous breathing, nonlabored ventilation and respiratory function stable Cardiovascular status: blood pressure returned to baseline and stable Postop Assessment: no apparent nausea or vomiting Anesthetic complications: no    Last Vitals:  Vitals:   06/30/19 1730 06/30/19 1745  BP: 136/82 138/87  Pulse: 80 80  Resp: 16 14  Temp:    SpO2: 100% 100%    Last Pain:  Vitals:   06/30/19 1745  TempSrc:   PainSc: 0-No pain                 Kaiesha Tonner A.

## 2019-06-30 NOTE — Anesthesia Procedure Notes (Signed)
Procedure Name: LMA Insertion Date/Time: 06/30/2019 4:32 PM Performed by: West Pugh, CRNA Pre-anesthesia Checklist: Patient identified, Emergency Drugs available, Suction available, Patient being monitored and Timeout performed Patient Re-evaluated:Patient Re-evaluated prior to induction Oxygen Delivery Method: Circle system utilized Preoxygenation: Pre-oxygenation with 100% oxygen Induction Type: IV induction LMA: LMA with gastric port inserted LMA Size: 4.0 Number of attempts: 1 Tube secured with: Tape Dental Injury: Teeth and Oropharynx as per pre-operative assessment

## 2019-06-30 NOTE — Op Note (Signed)
Procedure: 1.  Cystoscopy with right retrograde pyelogram and interpretation. 2.  Cystoscopy with balloon dilation of right distal ureteral stricture. 3.  Right ureteroscopic stone extraction. 4.  Cystoscopy with insertion of right double-J stent.  Preop diagnosis: 3 mm right distal ureteral stone.  Postop diagnosis: 1.  3 mm right distal ureteral stone. 2.  Right distal ureteral stricture.  Surgeon: Dr. Irine Seal.  Anesthesia: General.  Specimen: Stone.  Drain: 6 [place] by 26 cm right contour double-J stent with tether.  EBL: Minimal.  Complications: None.  Indications: Robert Rios is a 23 year old male who has a 3 mm right distal ureteral stone with persistent pain and nonprogression.  He is elected ureteroscopy.  Procedure: He was given 2 g of Ancef.  He was taken operating room where general anesthetic was induced.  He was placed in lithotomy position and fitted with PAS hose.  His perineum and genitalia were prepped with Betadine solution he was draped in usual sterile fashion.  Cystoscopy was performed using the 23 [place] scope and 30 degree lens.  Examination revealed a normal urethra.  The external sphincter was intact.  The prostatic urethra was short with mild hyperplasia but no obstruction.  Examination of the bladder demonstrated a smooth wall with normal mucosa.  No tumors, stones or inflammation were noted.  Ureteral orifices were unremarkable.  The right ureteral orifice was cannulated with a 5 [place] open-ended catheter and Omnipaque was instilled.  The right retrograde demonstrated a very delicate distal ureter for approximately 2 to 3 cm with a filling defect consistent with a stone noted above the delicate area and proximal hydronephrosis.  The 4.5 French semirigid ureteroscope was then advanced per urethra and introduced into the right ureteral orifice.  I was only able to advance the scope for approximately 2 cm before encountering a ureteral stricture.  A guidewire  was then passed through the scope and was advanced by the stone.  Once wire was to the kidney, the ureteroscope was removed and the cystoscope was replaced over the wire.  A 4 cm x 15 French high-pressure balloon was then advanced over the wire with the tip at the level of the stone.  The balloon was inflated to 18 atm.  There was a waist noted in the area of the stricture and that was disrupted at 18 atm.  The balloon was deflated and removed leaving the wire in position.  Ureteroscope was reinserted over the wire and the stone was visualized.  An engage basket was used to grasp and remove the stone intact.  Final inspection revealed no residual fragments but there was some mucosal tearing in the area of the stricture from the dilation and it was felt that the stent was indicated.  The cystoscope was then reinserted over the wire and a 6 [place] by 26 cm contour double-J stent with tether was passed to the kidney under fluoroscopic guidance.  The wire was removed, leaving a good coil in the kidney and a good coil in the bladder.  The bladder was then drained and the cystoscope was removed leaving the stent string exiting the urethra.  The string was secured to the patient's penis.  He was taken down from lithotomy position, his anesthetic was reversed and he was moved to recovery in stable condition.  He will be given his stone to bring to the office for analysis at follow-up.  There were no complications.

## 2019-06-30 NOTE — Discharge Instructions (Signed)
Ureteral Stent Implantation, Care After °This sheet gives you information about how to care for yourself after your procedure. Your health care provider may also give you more specific instructions. If you have problems or questions, contact your health care provider. °What can I expect after the procedure? °After the procedure, it is common to have: °· Nausea. °· Mild pain when you urinate. You may feel this pain in your lower back or lower abdomen. The pain should stop within a few minutes after you urinate. This may last for up to 1 week. °· A small amount of blood in your urine for several days. °Follow these instructions at home: °Medicines °· Take over-the-counter and prescription medicines only as told by your health care provider. °· If you were prescribed an antibiotic medicine, take it as told by your health care provider. Do not stop taking the antibiotic even if you start to feel better. °· Do not drive for 24 hours if you were given a sedative during your procedure. °· Ask your health care provider if the medicine prescribed to you requires you to avoid driving or using heavy machinery. °Activity °· Rest as told by your health care provider. °· Avoid sitting for a long time without moving. Get up to take short walks every 1-2 hours. This is important to improve blood flow and breathing. Ask for help if you feel weak or unsteady. °· Return to your normal activities as told by your health care provider. Ask your health care provider what activities are safe for you. °General instructions ° °· Watch for any blood in your urine. Call your health care provider if the amount of blood in your urine increases. °· If you have a catheter: °? Follow instructions from your health care provider about taking care of your catheter and collection bag. °? Do not take baths, swim, or use a hot tub until your health care provider approves. Ask your health care provider if you may take showers. You may only be allowed to  take sponge baths. °· Drink enough fluid to keep your urine pale yellow. °· Do not use any products that contain nicotine or tobacco, such as cigarettes, e-cigarettes, and chewing tobacco. These can delay healing after surgery. If you need help quitting, ask your health care provider. °· Keep all follow-up visits as told by your health care provider. This is important. °Contact a health care provider if: °· You have pain that gets worse or does not get better with medicine, especially pain when you urinate. °· You have difficulty urinating. °· You feel nauseous or you vomit repeatedly during a period of more than 2 days after the procedure. °Get help right away if: °· Your urine is dark red or has blood clots in it. °· You are leaking urine (have incontinence). °· The end of the stent comes out of your urethra. °· You cannot urinate. °· You have sudden, sharp, or severe pain in your abdomen or lower back. °· You have a fever. °· You have swelling or pain in your legs. °· You have difficulty breathing. °Summary °· After the procedure, it is common to have mild pain when you urinate that goes away within a few minutes after you urinate. This may last for up to 1 week. °· Watch for any blood in your urine. Call your health care provider if the amount of blood in your urine increases. °· Take over-the-counter and prescription medicines only as told by your health care provider. °· Drink   enough fluid to keep your urine pale yellow.  You may remove the stent by pulling the attached string on Thursday morning.  If you don't feel comfortable doing that, please call the office to have it removed.    Please bring your stone to your f/u appointment.  You may need to delay your return to work depending on how you do with the stent and after removal.   This information is not intended to replace advice given to you by your health care provider. Make sure you discuss any questions you have with your health care  provider. Document Released: 06/04/2013 Document Revised: 07/09/2018 Document Reviewed: 07/10/2018 Elsevier Patient Education  2020 [employer].

## 2019-06-30 NOTE — H&P (Signed)
CC: I have ureteral stone.  HPI: Robert Rios is a 23 year-old male patient who is here for ureteral stone.  The problem is on the right side. He first stated noticing pain on approximately 05/17/2019. He is currently having flank pain, nausea, and vomiting. He denies having back pain, groin pain, fever, and chills. Pain is occuring on the right side.   Robert Rios is a 23 yo WM who had the onset one month ago of N/V and hematuria and was treated for presumed salmonella. He had severe pain 2 weeks ago and went to the ER and was found to have 3.26m right distal stone without obstruction. His pain resolved but it came back over the weekend. He has some frequency for about a month and a half with nausea. He will have urgency. He has no other associated signs of symptoms.      ALLERGIES: No Allergies    MEDICATIONS: Tamsulosin Hcl 0.4 mg capsule  Hydrocodone-Acetaminophen 5 mg-325 mg tablet  Klonopin  Wellbutrin Sr 200 mg tablet,sustained-release 12 hr     GU PSH: None     PSH Notes: Eye Surgery, Ear Surgery, Tonsillectomy   NON-GU PSH: Remove Tonsils - 2009     GU PMH: Renal cyst, Renal cyst, acquired - 2014      PMH Notes:  2008-10-23 14:50:22 - Note: Normal Routine History And Physical Adolescent (12 - 148   NON-GU PMH: Personal history of other specified conditions, History of heartburn - 2014 Anxiety Depression    FAMILY HISTORY: Bipolar Disorder - Mother Heart Disease - Father Kidney Cancer - Runs in Family Kidney Stones - Runs in Family   SOCIAL HISTORY: Marital Status: Single Preferred Language: English; Race: White Current Smoking Status: Patient has never smoked.   Tobacco Use Assessment Completed: Used Tobacco in last 30 days? Drinks 4+ caffeinated drinks per day.     Notes: 1 son, 1 daughter    REVIEW OF SYSTEMS:    GU Review Male:   Patient reports frequent urination, get up at night to urinate, and trouble starting your stream. Patient denies hard to postpone  urination, burning/ pain with urination, leakage of urine, stream starts and stops, have to strain to urinate , erection problems, and penile pain.  Gastrointestinal (Upper):   Patient reports nausea and vomiting. Patient denies indigestion/ heartburn.  Gastrointestinal (Lower):   Patient denies diarrhea and constipation.  Constitutional:   Patient reports night sweats and fatigue. Patient denies fever and weight loss.  Skin:   Patient denies skin rash/ lesion and itching.  Eyes:   Patient denies blurred vision and double vision.  Ears/ Nose/ Throat:   Patient reports sinus problems. Patient denies sore throat.  Hematologic/Lymphatic:   Patient denies swollen glands and easy bruising.  Cardiovascular:   Patient denies leg swelling and chest pains.  Respiratory:   Patient reports cough. Patient denies shortness of breath.  Endocrine:   Patient reports excessive thirst.   Musculoskeletal:   Patient denies back pain and joint pain.  Neurological:   Patient reports dizziness. Patient denies headaches.  Psychologic:   Patient reports depression and anxiety.    VITAL SIGNS:      06/30/2019 10:09 AM  Weight 235 lb / 106.59 kg  Height 71 in / 180.34 cm  BP 128/84 mmHg  Pulse 76 /min  Temperature 98.7 F / 37.0 C  BMI 32.8 kg/m   MULTI-SYSTEM PHYSICAL EXAMINATION:    Constitutional: Well-nourished. No physical deformities. Normally developed. Good grooming.  Neck: Neck  symmetrical, not swollen. Normal tracheal position.  Respiratory: Normal breath sounds. No labored breathing, no use of accessory muscles.   Cardiovascular: Regular rate and rhythm. No murmur, no gallop.   Lymphatic: No enlargement, no tenderness of supraclavicular, neck lymph nodes.  Skin: No paleness, no jaundice, no cyanosis. No lesion, no ulcer, no rash.  Neurologic / Psychiatric: Oriented to time, oriented to place, oriented to person. No depression, no anxiety, no agitation.  Gastrointestinal: Abdominal tenderness in RLQ  and flank, obese. No mass, no rigidity.   Musculoskeletal: Normal gait and station of head and neck.     PAST DATA REVIEWED:  Source Of History:  Patient  Records Review:   Previous Hospital Records  Urine Test Review:   Urinalysis  X-Ray Review: KUB: Reviewed Films. Discussed With Patient.  C.T. Abdomen/Pelvis: Reviewed Films. Reviewed Report. Discussed With Patient.    Notes:                     ER records reviewed.    PROCEDURES:         KUB - [id number]  A single view of the abdomen is obtained. He has a 67m calcification in the right pelvis consistent with the stone seen on CT on 8/30. He has no other bone, gas and soft tissue abnormalities.       Patient confirmed No Neulasta OnPro Device.           Urinalysis w/Scope - 81001 Dipstick Dipstick Cont'd Micro  Specimen: Voided Bilirubin: Neg WBC/hpf: 0 - 5/hpf  Color: Yellow Ketones: Neg RBC/hpf: 3 - 10/hpf  Appearance: Clear Blood: Trace Bacteria: NS (Not Seen)  Specific Gravity: 1.010 Protein: 2+ Cystals: NS (Not Seen)  pH: 7.5 Urobilinogen: 0.2 Casts: NS (Not Seen)  Glucose: Neg Nitrites: Neg Trichomonas: Not Present    Leukocyte Esterase: Neg Mucous: Not Present      Epithelial Cells: 0 - 5/hpf      Yeast: NS (Not Seen)      Sperm: Not Present         Urinalysis w/Scope - 81001 Dipstick Dipstick Cont'd Micro  Color: Yellow Bilirubin: Neg mg/dL WBC/hpf: 0 - 5/hpf  Appearance: Clear Ketones: Neg mg/dL RBC/hpf: 3 - 10/hpf  Specific Gravity: 1.010 Blood: Trace ery/uL Bacteria: NS (Not Seen)  pH: 7.5 Protein: 2+ mg/dL Cystals: NS (Not Seen)  Glucose: Neg mg/dL Urobilinogen: 0.2 mg/dL Casts: NS (Not Seen)    Nitrites: Neg Trichomonas: Not Present    Leukocyte Esterase: Neg leu/uL Mucous: Not Present      Epithelial Cells: 0 - 5/hpf      Yeast: NS (Not Seen)      Sperm: Not Present         Ketoralac 661m- [id number]: 60 Adm. By: ErAlcide GoodnessUnit: mg Lot No [id number]: IM Exp. Date 01/05/2019  Freq: None  Mfgr.:   Site: Right Hip   ASSESSMENT:      ICD-10 Details  1 GU:   Ureteral calculus - N20.1 Right, He has a 32m55mUVJ stone with nonprogression. I discussed continued MET vs URS and will set him up for URS today. I have reviewed the risks of ureteroscopy including bleeding, infection, ureteral injury, need for a stent or secondary procedures, thrombotic events and anesthetic complications.    PLAN:           Orders X-Rays: KUB          Schedule Return Visit/Planned Activity: ASAP - Schedule Surgery  Note: right ureteroscopy today.  Procedure: 06/30/2019 - Cysto Uretero Lithotripsy - [phone]  Procedure: 06/30/2019 at [facility] - [place] 51m (Toradol Per 1Gleason - [id number] [phone]

## 2019-07-01 ENCOUNTER — Encounter (HOSPITAL_COMMUNITY): Payer: Self-pay | Admitting: Urology

## 2019-07-07 DIAGNOSIS — H5213 Myopia, bilateral: Secondary | ICD-10-CM | POA: Diagnosis not present

## 2019-07-07 DIAGNOSIS — R1084 Generalized abdominal pain: Secondary | ICD-10-CM | POA: Diagnosis not present

## 2019-07-07 DIAGNOSIS — Z87442 Personal history of urinary calculi: Secondary | ICD-10-CM | POA: Diagnosis not present

## 2019-07-07 DIAGNOSIS — R1031 Right lower quadrant pain: Secondary | ICD-10-CM | POA: Diagnosis not present

## 2019-07-22 DIAGNOSIS — F438 Other reactions to severe stress: Secondary | ICD-10-CM | POA: Diagnosis not present

## 2019-07-31 DIAGNOSIS — F438 Other reactions to severe stress: Secondary | ICD-10-CM | POA: Diagnosis not present

## 2019-08-07 DIAGNOSIS — F438 Other reactions to severe stress: Secondary | ICD-10-CM | POA: Diagnosis not present

## 2019-08-29 DIAGNOSIS — F4310 Post-traumatic stress disorder, unspecified: Secondary | ICD-10-CM | POA: Diagnosis not present

## 2019-10-15 DIAGNOSIS — F329 Major depressive disorder, single episode, unspecified: Secondary | ICD-10-CM | POA: Diagnosis not present

## 2019-10-15 DIAGNOSIS — F438 Other reactions to severe stress: Secondary | ICD-10-CM | POA: Diagnosis not present

## 2019-10-25 DIAGNOSIS — U071 COVID-19: Secondary | ICD-10-CM | POA: Diagnosis not present

## 2019-10-31 DIAGNOSIS — F4310 Post-traumatic stress disorder, unspecified: Secondary | ICD-10-CM | POA: Diagnosis not present

## 2019-10-31 DIAGNOSIS — U071 COVID-19: Secondary | ICD-10-CM | POA: Diagnosis not present

## 2020-03-16 DIAGNOSIS — S338XXA Sprain of other parts of lumbar spine and pelvis, initial encounter: Secondary | ICD-10-CM | POA: Diagnosis not present

## 2020-03-16 DIAGNOSIS — S233XXA Sprain of ligaments of thoracic spine, initial encounter: Secondary | ICD-10-CM | POA: Diagnosis not present

## 2020-03-16 DIAGNOSIS — S134XXA Sprain of ligaments of cervical spine, initial encounter: Secondary | ICD-10-CM | POA: Diagnosis not present

## 2020-07-29 DIAGNOSIS — Z6835 Body mass index (BMI) 35.0-35.9, adult: Secondary | ICD-10-CM | POA: Diagnosis not present

## 2020-07-29 DIAGNOSIS — Z1331 Encounter for screening for depression: Secondary | ICD-10-CM | POA: Diagnosis not present

## 2020-07-29 DIAGNOSIS — E6609 Other obesity due to excess calories: Secondary | ICD-10-CM | POA: Diagnosis not present

## 2020-07-29 DIAGNOSIS — F4310 Post-traumatic stress disorder, unspecified: Secondary | ICD-10-CM | POA: Diagnosis not present

## 2020-09-03 DIAGNOSIS — S233XXA Sprain of ligaments of thoracic spine, initial encounter: Secondary | ICD-10-CM | POA: Diagnosis not present

## 2020-09-03 DIAGNOSIS — S134XXA Sprain of ligaments of cervical spine, initial encounter: Secondary | ICD-10-CM | POA: Diagnosis not present

## 2020-09-03 DIAGNOSIS — S338XXA Sprain of other parts of lumbar spine and pelvis, initial encounter: Secondary | ICD-10-CM | POA: Diagnosis not present

## 2020-09-15 DIAGNOSIS — Z681 Body mass index (BMI) 19 or less, adult: Secondary | ICD-10-CM | POA: Diagnosis not present

## 2020-09-15 DIAGNOSIS — J22 Unspecified acute lower respiratory infection: Secondary | ICD-10-CM | POA: Diagnosis not present

## 2020-10-26 IMAGING — CT CT RENAL STONE PROTOCOL
2 of 4 series · 16 of 46 positions shown, 18 images · non-contrast
Comparison: None.

CLINICAL DATA: Right lower quadrant pain.

EXAM:
CT ABDOMEN AND PELVIS WITHOUT CONTRAST
TECHNIQUE: Multidetector CT imaging of the abdomen and pelvis was performed
following the standard protocol without IV contrast.

[Series 3: ap without · axial · non-contrast · 0.83mm/px · z∈[+738,+1198]mm · 13 of 104 slices shown, 15 images]
[im 6/104  soft-tissue]
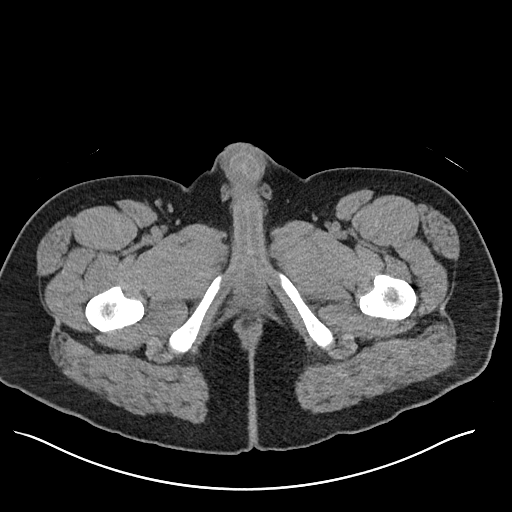
[im 6/104  bone]
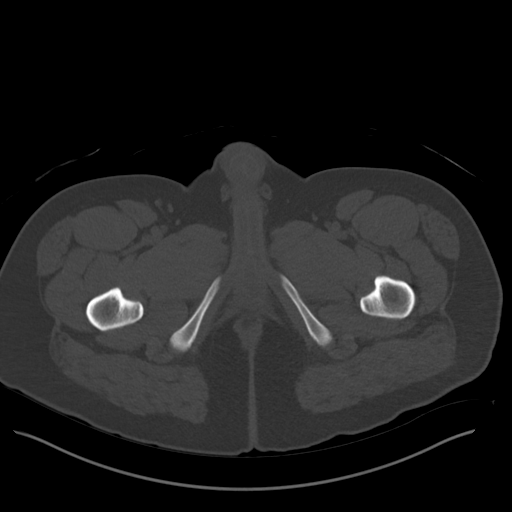
[im 16/104  soft-tissue]
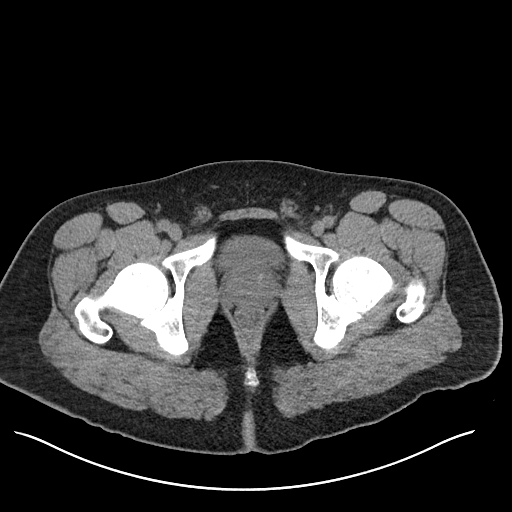
[im 21/104  soft-tissue]
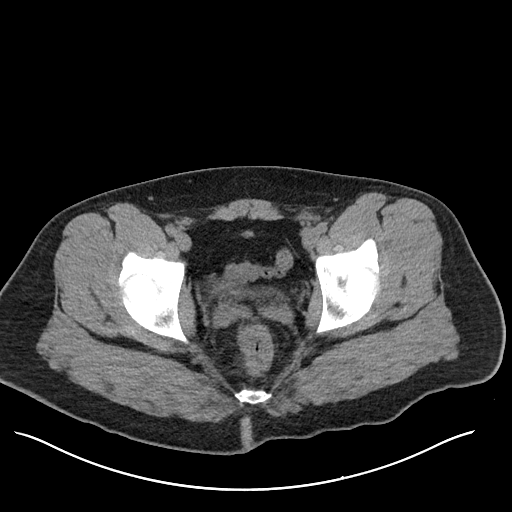
[im 31/104  soft-tissue]
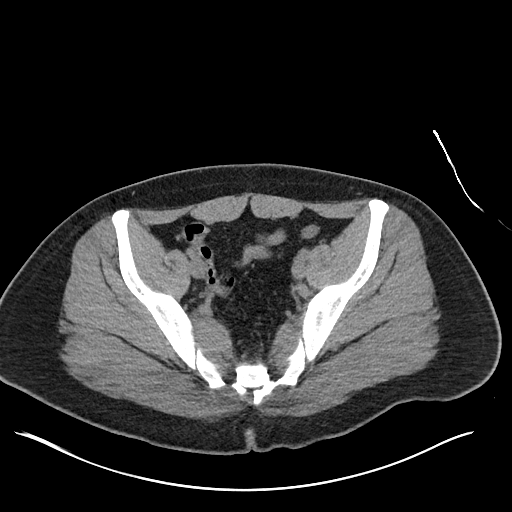
[im 37/104  soft-tissue]
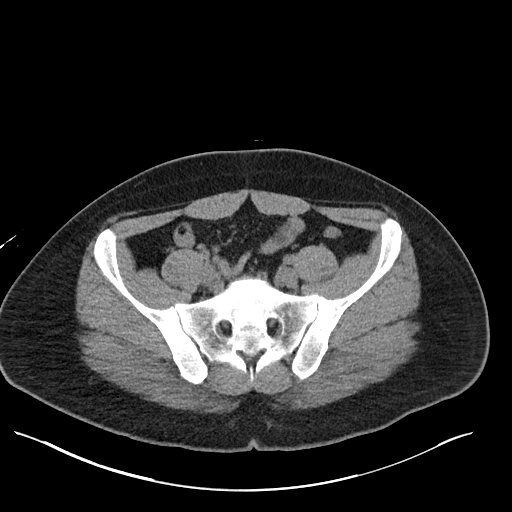
[im 47/104  soft-tissue]
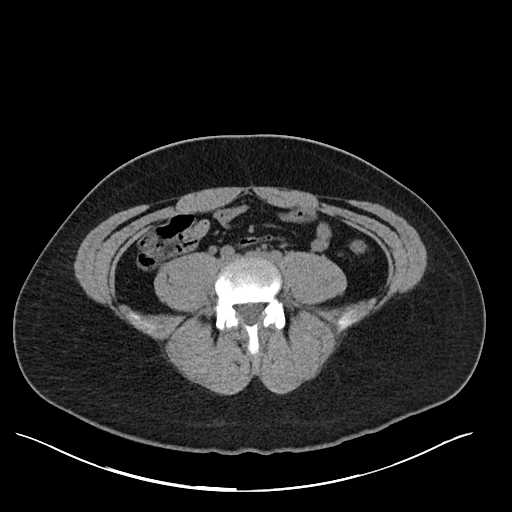
[im 52/104  soft-tissue]
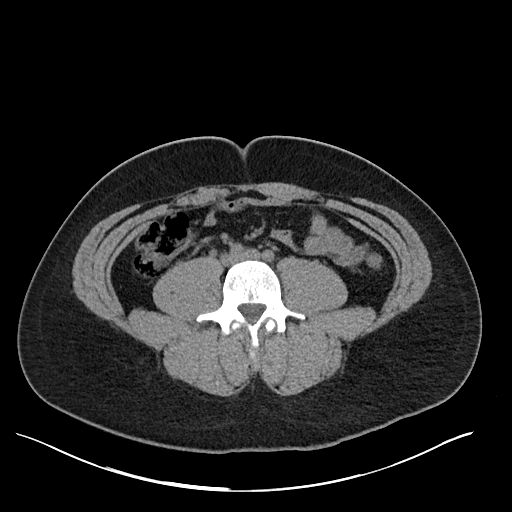
[im 57/104  soft-tissue]
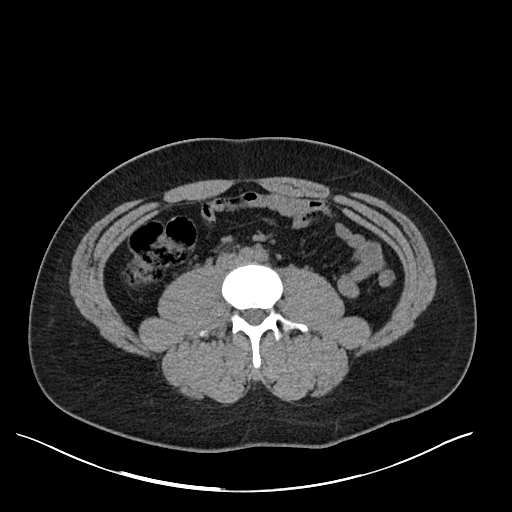
[im 67/104  soft-tissue]
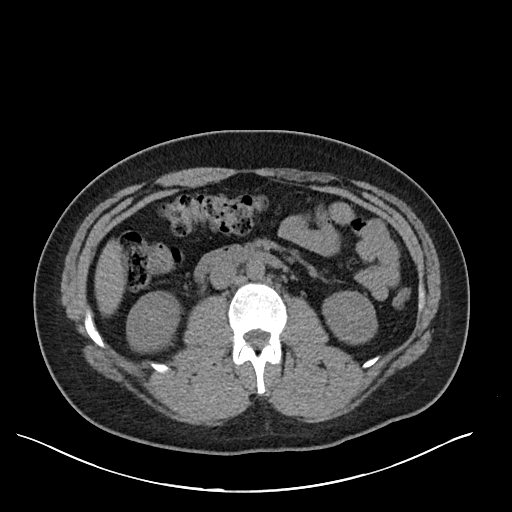
[im 67/104  bone]
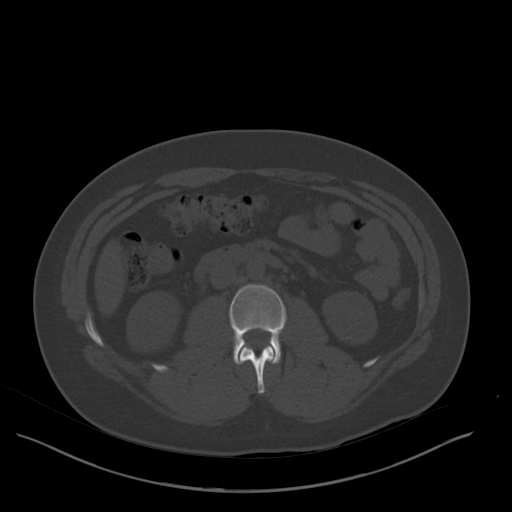
[im 73/104  soft-tissue]
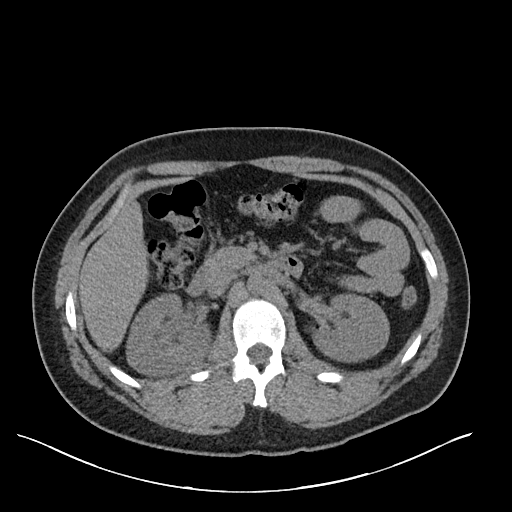
[im 83/104  soft-tissue]
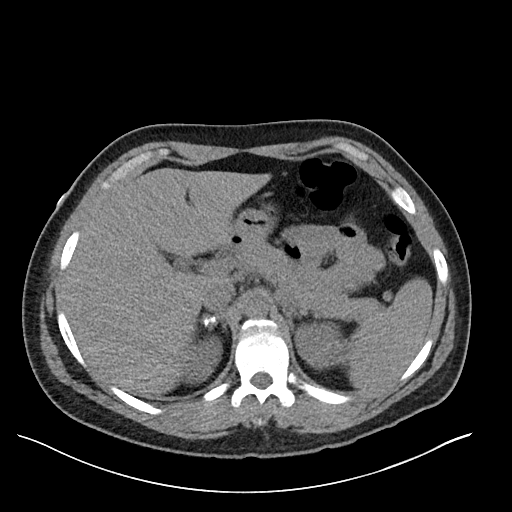
[im 88/104  soft-tissue]
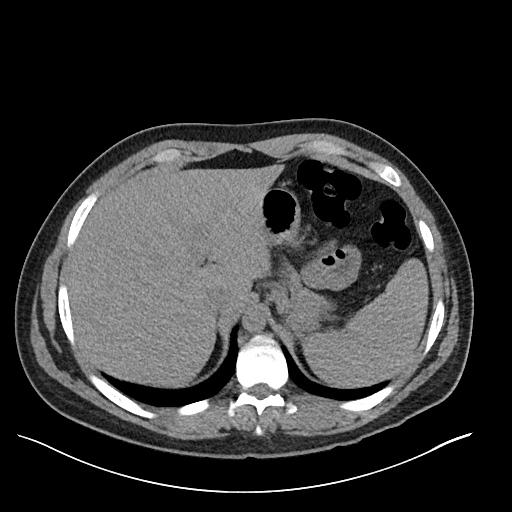
[im 98/104  soft-tissue]
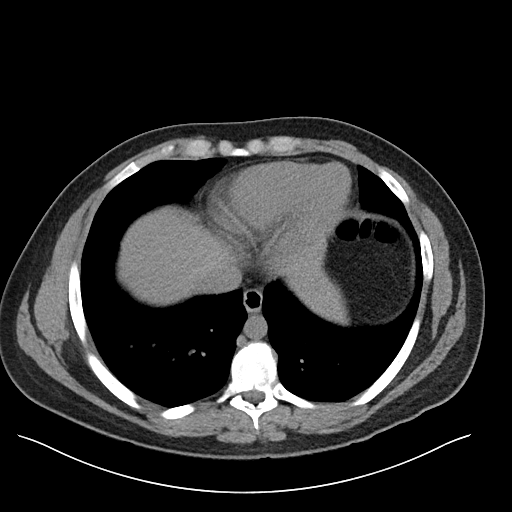

[Series 6: cor · coronal · 0.87mm/px · 3 of 102 slices shown]
[im 34/102  soft-tissue]
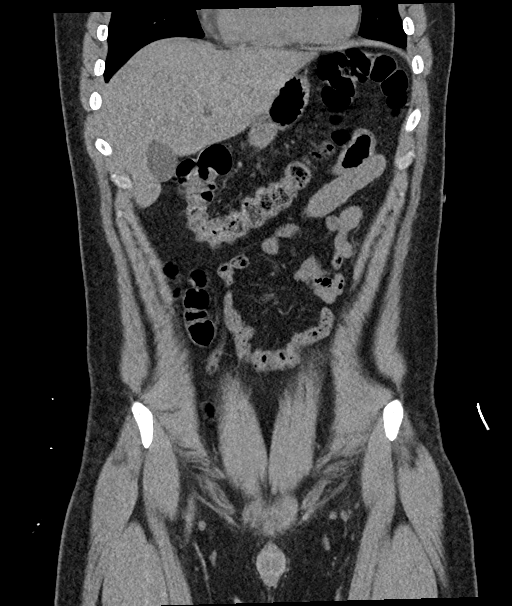
[im 45/102  soft-tissue]
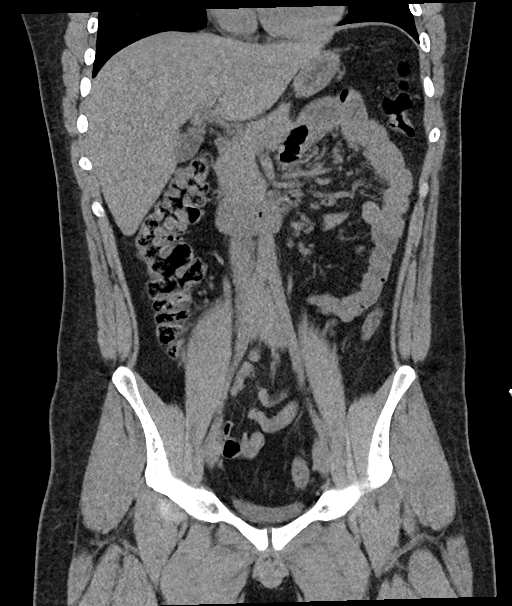
[im 57/102  soft-tissue]
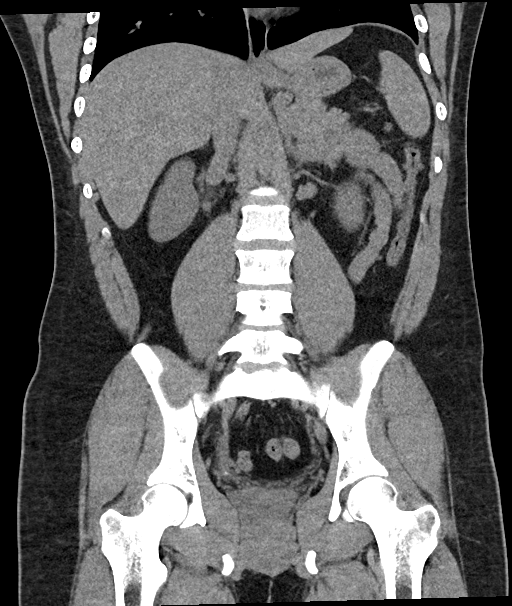

[16 of 46 positions shown; findings below may reference images not displayed]

FINDINGS: Lower chest: No acute abnormality.

Hepatobiliary: No focal liver abnormality is seen. No gallstones,
gallbladder wall thickening, or biliary dilatation.

Pancreas: Unremarkable. No pancreatic ductal dilatation or
surrounding inflammatory changes.

Spleen: Normal in size without focal abnormality.

Adrenals/Urinary Tract: The left adrenal gland is normal. There is
rounded calcification in the right adrenal gland measuring up to 13
mm. Right adrenal glands otherwise normal. No renal stones
identified. The left kidney and ureter are normal. There is mild
hydronephrosis on the left and mild right ureterectasis due to a
stone in the distal right ureter measuring 3.3 mm. The bladder is
decompressed but unremarkable.

Stomach/Bowel: Stomach is within normal limits. Appendix appears
normal. No evidence of bowel wall thickening, distention, or
inflammatory changes.

Vascular/Lymphatic: No significant vascular findings are present. No
enlarged abdominal or pelvic lymph nodes.

Reproductive: Prostate is unremarkable.

Other: No abdominal wall hernia or abnormality. No abdominopelvic
ascites.

Musculoskeletal: No acute or significant osseous findings.
IMPRESSION: 1. Mild right hydronephrosis and ureterectasis due to a 3.3 mm stone
in the distal right ureter.
2. Rounded calcification in the right adrenal gland is likely a site
of previous hemorrhage or infection and of doubtful significance.
3. No other abnormalities.

## 2020-12-09 DIAGNOSIS — F329 Major depressive disorder, single episode, unspecified: Secondary | ICD-10-CM | POA: Diagnosis not present

## 2020-12-09 DIAGNOSIS — R41840 Attention and concentration deficit: Secondary | ICD-10-CM | POA: Diagnosis not present

## 2021-01-19 DIAGNOSIS — F438 Other reactions to severe stress: Secondary | ICD-10-CM | POA: Diagnosis not present

## 2021-02-09 DIAGNOSIS — F438 Other reactions to severe stress: Secondary | ICD-10-CM | POA: Diagnosis not present

## 2021-02-18 DIAGNOSIS — Z1331 Encounter for screening for depression: Secondary | ICD-10-CM | POA: Diagnosis not present

## 2021-02-18 DIAGNOSIS — Z6833 Body mass index (BMI) 33.0-33.9, adult: Secondary | ICD-10-CM | POA: Diagnosis not present

## 2021-02-18 DIAGNOSIS — Z0001 Encounter for general adult medical examination with abnormal findings: Secondary | ICD-10-CM | POA: Diagnosis not present

## 2021-02-18 DIAGNOSIS — F329 Major depressive disorder, single episode, unspecified: Secondary | ICD-10-CM | POA: Diagnosis not present

## 2021-02-18 DIAGNOSIS — F4320 Adjustment disorder, unspecified: Secondary | ICD-10-CM | POA: Diagnosis not present

## 2021-02-18 DIAGNOSIS — Z113 Encounter for screening for infections with a predominantly sexual mode of transmission: Secondary | ICD-10-CM | POA: Diagnosis not present

## 2021-02-18 DIAGNOSIS — Z1389 Encounter for screening for other disorder: Secondary | ICD-10-CM | POA: Diagnosis not present

## 2021-03-02 DIAGNOSIS — F438 Other reactions to severe stress: Secondary | ICD-10-CM | POA: Diagnosis not present

## 2021-04-22 ENCOUNTER — Ambulatory Visit: Payer: Self-pay | Admitting: Internal Medicine

## 2021-05-03 DIAGNOSIS — S134XXA Sprain of ligaments of cervical spine, initial encounter: Secondary | ICD-10-CM | POA: Diagnosis not present

## 2021-05-03 DIAGNOSIS — S233XXA Sprain of ligaments of thoracic spine, initial encounter: Secondary | ICD-10-CM | POA: Diagnosis not present

## 2021-05-03 DIAGNOSIS — S338XXA Sprain of other parts of lumbar spine and pelvis, initial encounter: Secondary | ICD-10-CM | POA: Diagnosis not present

## 2021-06-30 ENCOUNTER — Other Ambulatory Visit: Payer: Self-pay

## 2021-06-30 ENCOUNTER — Emergency Department (HOSPITAL_COMMUNITY): Payer: BC Managed Care – PPO

## 2021-06-30 ENCOUNTER — Emergency Department (HOSPITAL_COMMUNITY)
Admission: EM | Admit: 2021-06-30 | Discharge: 2021-06-30 | Disposition: A | Discharge status: Home / Self Care | Payer: BC Managed Care – PPO | Attending: Emergency Medicine | Admitting: Emergency Medicine

## 2021-06-30 ENCOUNTER — Encounter (HOSPITAL_COMMUNITY): Payer: Self-pay

## 2021-06-30 DIAGNOSIS — E2749 Other adrenocortical insufficiency: Secondary | ICD-10-CM | POA: Diagnosis not present

## 2021-06-30 DIAGNOSIS — N289 Disorder of kidney and ureter, unspecified: Secondary | ICD-10-CM | POA: Diagnosis not present

## 2021-06-30 DIAGNOSIS — Z91040 Latex allergy status: Secondary | ICD-10-CM | POA: Diagnosis not present

## 2021-06-30 DIAGNOSIS — N200 Calculus of kidney: Secondary | ICD-10-CM | POA: Diagnosis not present

## 2021-06-30 DIAGNOSIS — N132 Hydronephrosis with renal and ureteral calculous obstruction: Secondary | ICD-10-CM | POA: Diagnosis not present

## 2021-06-30 DIAGNOSIS — R109 Unspecified abdominal pain: Secondary | ICD-10-CM | POA: Diagnosis not present

## 2021-06-30 DIAGNOSIS — R600 Localized edema: Secondary | ICD-10-CM | POA: Diagnosis not present

## 2021-06-30 LAB — CBC WITH DIFFERENTIAL/PLATELET
Abs Immature Granulocytes: 0.02 10*3/uL (ref 0.00–0.07)
Basophils Absolute: 0.1 10*3/uL (ref 0.0–0.1)
Basophils Relative: 1 %
Eosinophils Absolute: 0.1 10*3/uL (ref 0.0–0.5)
Eosinophils Relative: 1 %
HCT: 43.4 % (ref 39.0–52.0)
Hemoglobin: 15.1 g/dL (ref 13.0–17.0)
Immature Granulocytes: 0 %
Lymphocytes Relative: 21 %
Lymphs Abs: 1.8 10*3/uL (ref 0.7–4.0)
MCH: 30.5 pg (ref 26.0–34.0)
MCHC: 34.8 g/dL (ref 30.0–36.0)
MCV: 87.7 fL (ref 80.0–100.0)
Monocytes Absolute: 0.5 10*3/uL (ref 0.1–1.0)
Monocytes Relative: 7 %
Neutro Abs: 5.8 10*3/uL (ref 1.7–7.7)
Neutrophils Relative %: 70 %
Platelets: 219 10*3/uL (ref 150–400)
RBC: 4.95 MIL/uL (ref 4.22–5.81)
RDW: 12.6 % (ref 11.5–15.5)
WBC: 8.2 10*3/uL (ref 4.0–10.5)
nRBC: 0 % (ref 0.0–0.2)

## 2021-06-30 LAB — BASIC METABOLIC PANEL
Anion gap: 8 (ref 5–15)
BUN: 14 mg/dL (ref 6–20)
CO2: 28 mmol/L (ref 22–32)
Calcium: 9.3 mg/dL (ref 8.9–10.3)
Chloride: 104 mmol/L (ref 98–111)
Creatinine, Ser: 1.21 mg/dL (ref 0.61–1.24)
GFR, Estimated: >60 mL/min (ref >60)
Glucose, Bld: 118 mg/dL — ABNORMAL HIGH (ref 70–99)
Potassium: 3.5 mmol/L (ref 3.5–5.1)
Sodium: 140 mmol/L (ref 135–145)

## 2021-06-30 MED ORDER — MORPHINE SULFATE (PF) 4 MG/ML IV SOLN
4.0000 mg | Freq: Once | INTRAVENOUS | Status: AC
  Administered 2021-06-30: 4 mg via INTRAVENOUS
  Filled 2021-06-30: qty 1

## 2021-06-30 MED ORDER — KETOROLAC TROMETHAMINE 30 MG/ML IJ SOLN
30.0000 mg | Freq: Once | INTRAMUSCULAR | Status: AC
  Administered 2021-06-30: 30 mg via INTRAVENOUS
  Filled 2021-06-30: qty 1

## 2021-06-30 MED ORDER — ONDANSETRON HCL 4 MG/2ML IJ SOLN
4.0000 mg | Freq: Once | INTRAMUSCULAR | Status: AC
  Administered 2021-06-30: 4 mg via INTRAVENOUS
  Filled 2021-06-30: qty 2

## 2021-06-30 MED ORDER — OXYCODONE-ACETAMINOPHEN 5-325 MG PO TABS: 1.0000 | ORAL_TABLET | Freq: Four times a day (QID) | ORAL | 0 refills | Status: AC | PRN

## 2021-06-30 NOTE — Discharge Instructions (Addendum)
Take Percocet as prescribed as needed for pain.  Return to the emergency department if you develop high fever, worsening pain, or other new and concerning symptoms.

## 2021-06-30 NOTE — ED Triage Notes (Signed)
Pt presents to ED with left sided flank pain that started tonight, pt also has urinary frequency and urgency, says he had kidney stone last year and feels the same.

## 2021-06-30 NOTE — ED Notes (Signed)
Pt experienced light headedness and near syncope following IV med administration. HR noted to be 42. MD made aware.

## 2021-06-30 NOTE — ED Provider Notes (Signed)
[facility] EMERGENCY DEPARTMENT Provider Note   CSN: [id number] Arrival date & time: 06/30/21  7939     History Chief Complaint  Patient presents with   Flank Pain    Robert Rios is a 25 y.o. male.  Patient is a 25 year old male with history of kidney stones.  Patient presenting today with complaints of left flank pain.  This started acutely several hours prior to presentation.  He denies any injury or trauma.  He does describe some dark urine, but denies any fevers or dysuria.  There are no aggravating or alleviating factors and pain feels similar to what he felt with prior kidney stones.  The history is provided by the patient.  Flank Pain This is a new problem. The current episode started 3 to 5 hours ago. The problem occurs constantly. The problem has been rapidly worsening. Nothing aggravates the symptoms. Nothing relieves the symptoms. He has tried nothing for the symptoms.      History reviewed. No pertinent past medical history.  Patient Active Problem List   Diagnosis Date Noted   SPONDYLOLYSIS 03/25/2008   LOW BACK PAIN 03/11/2008   LUMBAR SPASM 03/11/2008   SUBLUXATION-RADIAL HEAD 03/11/2008    Past Surgical History:  Procedure Laterality Date   CYSTOSCOPY WITH RETROGRADE PYELOGRAM, URETEROSCOPY AND STENT PLACEMENT Right 06/30/2019   Procedure: CYSTOSCOPY WITH RETROGRADE PYELOGRAM,RIGHT URETEROSCOPY, STENT PLACEMENT, BASKET STONE REMOVAL;  Surgeon: Bjorn Pippin, MD;  Location: [facility];  Service: Urology;  Laterality: Right;   TONSILLECTOMY     WISDOM TOOTH EXTRACTION         No family history on file.  Social History   Tobacco Use   Smoking status: Never   Smokeless tobacco: Never  Substance Use Topics   Alcohol use: No   Drug use: No    Home Medications Prior to Admission medications   Medication Sig Start Date End Date Taking? Authorizing Provider  buPROPion (WELLBUTRIN XL) 300 MG 24 hr tablet Take 300 mg by mouth daily. 06/13/19   [provider]  clonazePAM (KLONOPIN) 0.5 MG tablet Take 0.5 mg by mouth 2 (two) times daily as needed for anxiety. 05/30/19   [provider]  HYDROcodone-acetaminophen (NORCO/VICODIN) 5-325 MG tablet Take 1-2 tablets by mouth every 6 (six) hours as needed. 06/15/19   Maxwell Caul, PA-C  ondansetron (ZOFRAN ODT) 4 MG disintegrating tablet Take 1 tablet (4 mg total) by mouth every 8 (eight) hours as needed for nausea or vomiting. 06/15/19   Maxwell Caul, PA-C  phenazopyridine (PYRIDIUM) 200 MG tablet Take 1 tablet (200 mg total) by mouth 3 (three) times daily as needed for pain. 06/30/19   Bjorn Pippin, MD  tamsulosin (FLOMAX) 0.4 MG CAPS capsule Take 1 capsule (0.4 mg total) by mouth daily. 06/15/19   Maxwell Caul, PA-C    Allergies    Latex  Review of Systems   Review of Systems  Genitourinary:  Positive for flank pain.  All other systems reviewed and are negative.  Physical Exam Updated Vital Signs BP (!) 171/105 Comment: Simultaneous filing. User may not have seen previous data.  Pulse 80   Temp 97.9 F (36.6 C) (Oral)   Resp 17   Ht 5\' 11"  (1.803 m)   Wt 102.1 kg   SpO2 99%   BMI 31.38 kg/m   Physical Exam Vitals and nursing note reviewed.  Constitutional:      General: He is not in acute distress.    Appearance: He is  well-developed. He is not diaphoretic.  HENT:     Head: Normocephalic and atraumatic.  Cardiovascular:     Rate and Rhythm: Normal rate and regular rhythm.     Heart sounds: No murmur heard.   No friction rub.  Pulmonary:     Effort: Pulmonary effort is normal. No respiratory distress.     Breath sounds: Normal breath sounds. No wheezing or rales.  Abdominal:     General: Bowel sounds are normal. There is no distension.     Palpations: Abdomen is soft.     Tenderness: There is no abdominal tenderness. There is left CVA tenderness. There is no right CVA tenderness, guarding or rebound.  Musculoskeletal:        General: Normal range  of motion.     Cervical back: Normal range of motion and neck supple.  Skin:    General: Skin is warm and dry.  Neurological:     Mental Status: He is alert and oriented to person, place, and time.     Coordination: Coordination normal.    ED Results / Procedures / Treatments   Labs (all labs ordered are listed, but only abnormal results are displayed) Labs Reviewed  BASIC METABOLIC PANEL  CBC WITH DIFFERENTIAL/PLATELET  URINALYSIS, ROUTINE W REFLEX MICROSCOPIC    EKG None  Radiology No results found.  Procedures Procedures   Medications Ordered in ED Medications  ondansetron (ZOFRAN) injection 4 mg (has no administration in time range)  ketorolac (TORADOL) 30 MG/ML injection 30 mg (has no administration in time range)  morphine 4 MG/ML injection 4 mg (has no administration in time range)    ED Course  I have reviewed the triage vital signs and the nursing notes.  Pertinent labs & imaging results that were available during my care of the patient were reviewed by me and considered in my medical decision making (see chart for details).    MDM Rules/Calculators/A&P  Patient presenting with left flank pain caused by a 1 mm calculus at the left UVJ identified by CT scan.  Patient with no fever or white count and is nontoxic-appearing.  He is feeling better after receiving medications here in the ER.  Patient to be discharged with pain medication and as needed return.  Final Clinical Impression(s) / ED Diagnoses Final diagnoses:  None    Rx / DC Orders ED Discharge Orders     None        Geoffery Lyons, MD 07/01/21 0004

## 2021-07-07 DIAGNOSIS — R519 Headache, unspecified: Secondary | ICD-10-CM | POA: Diagnosis not present

## 2021-07-07 DIAGNOSIS — G8929 Other chronic pain: Secondary | ICD-10-CM | POA: Diagnosis not present

## 2021-07-07 DIAGNOSIS — E6609 Other obesity due to excess calories: Secondary | ICD-10-CM | POA: Diagnosis not present

## 2021-07-07 DIAGNOSIS — L723 Sebaceous cyst: Secondary | ICD-10-CM | POA: Diagnosis not present

## 2021-07-25 DIAGNOSIS — R1084 Generalized abdominal pain: Secondary | ICD-10-CM | POA: Diagnosis not present

## 2021-07-25 DIAGNOSIS — N201 Calculus of ureter: Secondary | ICD-10-CM | POA: Diagnosis not present

## 2021-07-28 DIAGNOSIS — N201 Calculus of ureter: Secondary | ICD-10-CM | POA: Diagnosis not present

## 2021-09-12 DIAGNOSIS — R6889 Other general symptoms and signs: Secondary | ICD-10-CM | POA: Diagnosis not present

## 2021-09-12 DIAGNOSIS — M791 Myalgia, unspecified site: Secondary | ICD-10-CM | POA: Diagnosis not present

## 2021-09-13 DIAGNOSIS — J101 Influenza due to other identified influenza virus with other respiratory manifestations: Secondary | ICD-10-CM | POA: Diagnosis not present

## 2021-09-13 DIAGNOSIS — J22 Unspecified acute lower respiratory infection: Secondary | ICD-10-CM | POA: Diagnosis not present

## 2021-12-28 ENCOUNTER — Ambulatory Visit: Payer: BC Managed Care – PPO | Admitting: Behavioral Health

## 2022-01-23 ENCOUNTER — Ambulatory Visit: Payer: BC Managed Care – PPO | Admitting: Behavioral Health

## 2022-01-24 ENCOUNTER — Encounter: Payer: Self-pay | Admitting: Behavioral Health

## 2022-01-24 ENCOUNTER — Ambulatory Visit (INDEPENDENT_AMBULATORY_CARE_PROVIDER_SITE_OTHER): Payer: Self-pay | Admitting: Behavioral Health

## 2022-01-24 VITALS — BP 147/95 | HR 74 | Ht 71.0 in | Wt 228.0 lb

## 2022-01-24 DIAGNOSIS — F514 Sleep terrors [night terrors]: Secondary | ICD-10-CM

## 2022-01-24 DIAGNOSIS — F331 Major depressive disorder, recurrent, moderate: Secondary | ICD-10-CM

## 2022-01-24 DIAGNOSIS — F411 Generalized anxiety disorder: Secondary | ICD-10-CM

## 2022-01-24 MED ORDER — VILAZODONE HCL 20 MG PO TABS: 20.0000 mg | ORAL_TABLET | Freq: Every day | ORAL | 1 refills | Status: DC

## 2022-01-24 MED ORDER — BUPROPION HCL ER (XL) 300 MG PO TB24: 300.0000 mg | ORAL_TABLET | Freq: Every day | ORAL | 3 refills | Status: DC

## 2022-01-24 MED ORDER — PRAZOSIN HCL 2 MG PO CAPS: ORAL_CAPSULE | ORAL | 1 refills | Status: DC

## 2022-01-24 NOTE — Progress Notes (Signed)
[facility] MD/PA/NP Initial Note ? ?01/24/2022 10:07 AM ?Robert Rios  ?MRN:  [id number] ? ?Chief Complaint:  ?Chief Complaint   ?Depression; Trauma; Anxiety; Establish Care; Medication Refill; Medication Problem ?  ? ? ?HPI:  ? ?26 year old presents to this office for initial visit and to establish care. He says that he is here today because his PCP felt that he needed more specialized care managing his psychotropic medication. He said he had a medication error when he mistakenly was taking 900 mg of Wellbutrin for 3 months. His PCP caught the mistake and he reduced back down to 300 mg. He says now the medication is not working well. He says he still has issues with anxiety, and some mild depression due to his exposure to trauma as a child. 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 He is requesting adjunctive medication that may help with his anxiety, reaction to trauma, and mild depression. He endorses, periods of talkativeness, lack of concentration and risky or foolish behaviors. He denies mania, no psychosis. No SI/HI ? ?Prior psychiatric medication: ?Wellbutrin  ? ? ? ?Visit Diagnosis:  ?  ICD-10-CM   ?1. Generalized anxiety disorder  F41.1 Vilazodone HCl 20 MG TABS  ?  ?2. Major depressive disorder, recurrent episode, moderate (HCC)  F33.1 Vilazodone HCl 20 MG TABS  ?  ?3. Night terror  F51.4 prazosin (MINIPRESS) 2 MG capsule  ?  ? ? ?Past Psychiatric History: Anxiety, PTSD, Trauma, MDD ? ?Past Medical History: History reviewed. No pertinent past medical history.  ?Past Surgical History:  ?Procedure Laterality Date  ? CYSTOSCOPY WITH RETROGRADE PYELOGRAM, URETEROSCOPY AND STENT PLACEMENT Right 06/30/2019  ? Procedure: CYSTOSCOPY WITH RETROGRADE PYELOGRAM,RIGHT URETEROSCOPY, STENT PLACEMENT, BASKET STONE REMOVAL;  Surgeon: Irine Seal, MD;  Location: [facility];  Service: Urology;  Laterality: Right;  ? TONSILLECTOMY    ? WISDOM TOOTH  EXTRACTION    ? ? ?Family Psychiatric History: see chart ? ?Family History:  ?Family History  ?Problem Relation Age of Onset  ? OCD Mother   ? Depression Mother   ? Anxiety disorder Mother   ? Bipolar disorder Mother   ? Alcohol abuse Maternal Aunt   ? ? ?Social History:  ?Social History  ? ?Socioeconomic History  ? Marital status: Significant Other  ?  Spouse name: [place]  ? Number of children: 2  ? Years of education: 80  ? Highest education level: Some college, no degree  ?Occupational History  ? Not on file  ?Tobacco Use  ? Smoking status: Never  ? Smokeless tobacco: Never  ?Substance and Sexual Activity  ? Alcohol use: No  ? Drug use: No  ? Sexual activity: Yes  ?Other Topics Concern  ? Not on file  ?Social History Narrative  ? Lives with girlfriend in [place] with 4 children.   ? ?Social Determinants of Health  ? ?Financial Resource Strain: Not on file  ?Food Insecurity: Not on file  ?Transportation Needs: Not on file  ?Physical Activity: Not on file  ?Stress: Not on file  ?Social Connections: Not on file  ? ? ?Allergies:  ?Allergies  ?Allergen Reactions  ? Morphine And Related   ?  Respiratory depression  ? Latex Rash  ? ? ?Metabolic Disorder Labs: ?No results found for: HGBA1C, MPG ?No results found for: PROLACTIN ?No results found for: CHOL, TRIG, HDL, CHOLHDL, VLDL, LDLCALC ?No results found for: TSH ? ?Therapeutic Level  Labs: ?No results found for: LITHIUM ?No results found for: VALPROATE ?No components found for:  CBMZ ? ?Current Medications: ?Current Outpatient Medications  ?Medication Sig Dispense Refill  ? prazosin (MINIPRESS) 2 MG capsule Take 1-2 capsules at bedtime as needed for night terror. 60 capsule 1  ? Vilazodone HCl 20 MG TABS Take 1 tablet (20 mg total) by mouth daily after breakfast. 30 tablet 1  ? buPROPion (WELLBUTRIN XL) 300 MG 24 hr tablet Take 1 tablet (300 mg total) by mouth daily. 30 tablet 3  ? clonazePAM (KLONOPIN) 0.5 MG tablet Take 0.5 mg by mouth 2 (two) times daily as needed  for anxiety.    ? HYDROcodone-acetaminophen (NORCO/VICODIN) 5-325 MG tablet Take 1-2 tablets by mouth every 6 (six) hours as needed. (Patient not taking: Reported on 01/24/2022) 10 tablet 0  ? ondansetron (ZOFRAN ODT) 4 MG disintegrating tablet Take 1 tablet (4 mg total) by mouth every 8 (eight) hours as needed for nausea or vomiting. (Patient not taking: Reported on 01/24/2022) 6 tablet 0  ? oxyCODONE-acetaminophen (PERCOCET) 5-325 MG tablet Take 1-2 tablets by mouth every 6 (six) hours as needed. (Patient not taking: Reported on 01/24/2022) 15 tablet 0  ? phenazopyridine (PYRIDIUM) 200 MG tablet Take 1 tablet (200 mg total) by mouth 3 (three) times daily as needed for pain. (Patient not taking: Reported on 01/24/2022) 10 tablet 0  ? tamsulosin (FLOMAX) 0.4 MG CAPS capsule Take 1 capsule (0.4 mg total) by mouth daily. (Patient not taking: Reported on 01/24/2022) 14 capsule 0  ? ?No current facility-administered medications for this visit.  ? ? ?Medication Side Effects: none ? ?Orders placed this visit:  No orders of the defined types were placed in this encounter. ? ? ?Psychiatric Specialty Exam: ? ?Review of Systems  ?Constitutional: Negative.   ?Musculoskeletal:  Negative for gait problem.  ?Allergic/Immunologic: Negative.   ?Neurological:  Negative for tremors.  ?Psychiatric/Behavioral:  Positive for dysphoric mood and sleep disturbance. The patient is nervous/anxious.    ?Blood pressure (!) 147/95, pulse 74, height 5\' 11"  (1.803 m), weight 228 lb (103.4 kg).Body mass index is 31.8 kg/m?.  ?General Appearance: Casual and Neat  ?Eye Contact:  Good  ?Speech:  Clear and Coherent  ?Volume:  Normal  ?Mood:  Anxious  ?Affect:  Appropriate, Depressed, and Anxious  ?Thought Process:  Coherent  ?Orientation:  Full (Time, Place, and Person)  ?Thought Content: Logical   ?Suicidal Thoughts:  No  ?Homicidal Thoughts:  No  ?Memory:  WNL  ?Judgement:  Good  ?Insight:  Good  ?Psychomotor Activity:  Normal  ?Concentration:   Concentration: Good  ?Recall:  Good  ?Fund of Knowledge: Good  ?Language: Good  ?Assets:  Desire for Improvement  ?ADL's:  Intact  ?Cognition: WNL  ?Prognosis:  Good  ? ?Screenings:  ?PHQ2-9   ? ?[place] Office Visit from 01/24/2022 in [facility]  ?PHQ-2 Total Score 1  ? ?  ? ?[facility] ED from 06/30/2021 in [place]  ?C-SSRS RISK CATEGORY No Risk  ? ?  ? ? ?Receiving Psychotherapy: No  ? ?Treatment Plan/Recommendations:  ? ?Greater than 50% of 60 min face to face time with patient was spent on counseling and coordination of care. We discussed his hx of anxiety and mild depression. 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 Discussed his hx of night terror. Reviewed previous tx and plan of care. ?We agreed to; ?Continue Wellbutrin 300 mg  XL daily ?To start Viibryd  10 mg for 7 days, then 20 mg daily ?Continue Prazosin  2 mg -4 mg at bedtime for night terror.  ?To follow up in 4 weeks to reassess ?Provided emergency contact information ?Reviewed PDMP ? ? ? ? ? ?Elwanda Brooklyn, NP ? ?         ?

## 2022-02-22 ENCOUNTER — Encounter: Payer: Self-pay | Admitting: Behavioral Health

## 2022-02-22 ENCOUNTER — Ambulatory Visit (INDEPENDENT_AMBULATORY_CARE_PROVIDER_SITE_OTHER): Payer: PRIVATE HEALTH INSURANCE | Admitting: Behavioral Health

## 2022-02-22 DIAGNOSIS — F331 Major depressive disorder, recurrent, moderate: Secondary | ICD-10-CM

## 2022-02-22 DIAGNOSIS — F411 Generalized anxiety disorder: Secondary | ICD-10-CM

## 2022-02-22 MED ORDER — VILAZODONE HCL 20 MG PO TABS: 20.0000 mg | ORAL_TABLET | Freq: Every day | ORAL | 2 refills | Status: DC

## 2022-02-22 NOTE — Progress Notes (Signed)
[facility] ? ?Patient ID: Jari Favre,  ?MRN: [id number] ? ?PCP: Redmond School, MD ? ?Date of Evaluation: 02/22/2022 ?Time spent:20 minutes ? ?Chief Complaint:  ?Chief Complaint   ?Anxiety; Depression; Follow-up; Medication Refill; Medication Problem ?  ? ? ?HISTORY/CURRENT STATUS: ?HPI ? ?26 year old presents to this office for follow up and medication management. He says that he feels like he has improved 75% with anxiety and depression since starting the Viibryd. For now he is happy with the medication and would like to continue on this regimen for a few more weeks before considering any dosage changes. He says his anxiety today is 3/10 and depression is 2/10. He is sleeping 7-8 hours total per night. He would like to discuss or screen for ADHD next visit. Says he is still having problems completing task and gets sidetracked easily. This is frustrating to him an he often abandons the projects out of frustration.  ? He denies mania, no psychosis. No SI/HI ?  ?Prior psychiatric medication: ?Wellbutrin  ? ? ? ? ?Individual Medical History/ Review of Systems: Changes? :No  ? ?Allergies: Morphine and related and Latex ? ?Current Medications:  ?Current Outpatient Medications:  ?  clonazePAM (KLONOPIN) 0.5 MG tablet, Take 0.5 mg by mouth 2 (two) times daily as needed for anxiety., Disp: , Rfl:  ?  HYDROcodone-acetaminophen (NORCO/VICODIN) 5-325 MG tablet, Take 1-2 tablets by mouth every 6 (six) hours as needed. (Patient not taking: Reported on 01/24/2022), Disp: 10 tablet, Rfl: 0 ?  ondansetron (ZOFRAN ODT) 4 MG disintegrating tablet, Take 1 tablet (4 mg total) by mouth every 8 (eight) hours as needed for nausea or vomiting. (Patient not taking: Reported on 01/24/2022), Disp: 6 tablet, Rfl: 0 ?  oxyCODONE-acetaminophen (PERCOCET) 5-325 MG tablet, Take 1-2 tablets by mouth every 6 (six) hours as needed. (Patient not taking: Reported on 01/24/2022), Disp: 15 tablet, Rfl: 0 ?  phenazopyridine (PYRIDIUM) 200  MG tablet, Take 1 tablet (200 mg total) by mouth 3 (three) times daily as needed for pain. (Patient not taking: Reported on 01/24/2022), Disp: 10 tablet, Rfl: 0 ?  prazosin (MINIPRESS) 2 MG capsule, Take 1-2 capsules at bedtime as needed for night terror., Disp: 60 capsule, Rfl: 1 ?  tamsulosin (FLOMAX) 0.4 MG CAPS capsule, Take 1 capsule (0.4 mg total) by mouth daily. (Patient not taking: Reported on 01/24/2022), Disp: 14 capsule, Rfl: 0 ?  Vilazodone HCl 20 MG TABS, Take 1 tablet (20 mg total) by mouth daily after breakfast., Disp: 30 tablet, Rfl: 2 ?Medication Side Effects: none ? ?Family Medical/ Social History: Changes? No ? ?MENTAL HEALTH EXAM: ? ?There were no vitals taken for this visit.There is no height or weight on file to calculate BMI.  ?General Appearance: Casual, Neat, and Well Groomed  ?Eye Contact:  Good  ?Speech:  Clear and Coherent  ?Volume:  Normal  ?Mood:  NA  ?Affect:  Appropriate  ?Thought Process:  Coherent  ?Orientation:  Full (Time, Place, and Person)  ?Thought Content: Logical   ?Suicidal Thoughts:  No  ?Homicidal Thoughts:  No  ?Memory:  WNL  ?Judgement:  Good  ?Insight:  Good  ?Psychomotor Activity:  Normal  ?Concentration:  Concentration: Good  ?Recall:  Good  ?Fund of Knowledge: Good  ?Language: Good  ?Assets:  Desire for Improvement  ?ADL's:  Intact  ?Cognition: WNL  ?Prognosis:  Good  ? ? ?DIAGNOSES:  ?  ICD-10-CM   ?1. Generalized anxiety disorder  F41.1 Vilazodone HCl 20 MG TABS  ?  ?  2. Major depressive disorder, recurrent episode, moderate (HCC)  F33.1 Vilazodone HCl 20 MG TABS  ?  ? ? ?Receiving Psychotherapy: No  ? ? ?RECOMMENDATIONS:  ? ?  ?Greater than 50% of 20 min face to face time with patient was spent on counseling and coordination of care. We discussed his moderate improvement with Viibryd. He says that he stopped Wellbutrin because when he was taking the two medications together, he started to vomit. This has resolved and he is only taking Viibryd now.  He has not start  Prazosin for night terror yet. Pharm did not have medication in stock. 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  ?We agreed to; ?Stopped Wellbutrin 300 mg XL daily ?Continue Viibryd  20 mg daily. ?Start  Prazosin  2 mg -4 mg at bedtime for night terror.  ?To follow up in 6 weeks to reassess ?Provided emergency contact information ?Reviewed PDMP ?  ? ? ?Elwanda Brooklyn, NP  ?

## 2022-03-06 ENCOUNTER — Telehealth: Payer: Self-pay | Admitting: Behavioral Health

## 2022-03-06 NOTE — Telephone Encounter (Signed)
Patient is c/o joint pain. He is relating to the same time frame as starting Viibryd, but doesn't know if it is just a coincidence. He said the pain is not constant, but it is excruciating, especially at night. He said he has taken OTC pain relievers without benefit. He said it started in his toes and has now moved to his fingers and elbows. Denies any leg pain at this time. He is a Publishing rights manager.

## 2022-03-06 NOTE — Telephone Encounter (Signed)
Patient notified

## 2022-03-06 NOTE — Telephone Encounter (Signed)
Pt LVM 5/19 @ 4:29p.  He said he's not sure if he's having side effects from medication that Arlys John has him on or maybe he's just getting old, but he is having severe joint pain.   Next appt 6/21

## 2022-03-06 NOTE — Telephone Encounter (Signed)
Highly unusual and unlikely from the medication. Please advise he should continue taking and follow up with his PCP reference this pain.

## 2022-04-05 ENCOUNTER — Encounter: Payer: Self-pay | Admitting: Behavioral Health

## 2022-04-05 ENCOUNTER — Ambulatory Visit (INDEPENDENT_AMBULATORY_CARE_PROVIDER_SITE_OTHER): Payer: PRIVATE HEALTH INSURANCE | Admitting: Behavioral Health

## 2022-04-05 DIAGNOSIS — F411 Generalized anxiety disorder: Secondary | ICD-10-CM

## 2022-04-05 DIAGNOSIS — F331 Major depressive disorder, recurrent, moderate: Secondary | ICD-10-CM | POA: Diagnosis not present

## 2022-04-05 DIAGNOSIS — F514 Sleep terrors [night terrors]: Secondary | ICD-10-CM | POA: Diagnosis not present

## 2022-04-05 MED ORDER — VILAZODONE HCL 20 MG PO TABS: 20.0000 mg | ORAL_TABLET | Freq: Every day | ORAL | 3 refills | Status: DC

## 2022-04-05 MED ORDER — PRAZOSIN HCL 2 MG PO CAPS: ORAL_CAPSULE | ORAL | 3 refills | Status: DC

## 2022-04-05 MED ORDER — CLONAZEPAM 0.5 MG PO TABS: 0.5000 mg | ORAL_TABLET | Freq: Two times a day (BID) | ORAL | 1 refills | Status: AC | PRN

## 2022-04-05 NOTE — Progress Notes (Signed)
[facility]  Patient ID: Robert Rios,  MRN: [id number]  PCP: Elfredia Nevins, MD  Date of Evaluation: 04/05/2022 Time spent:20 minutes  Chief Complaint:  Chief Complaint   Anxiety; Depression; Follow-up; Medication Refill     HISTORY/CURRENT STATUS: HPI  26 year old presents to this office for follow up and medication management. He says that he feels like he has improved 75% with anxiety and depression since starting the Viibryd. No changes this visit.  For now he is happy with the medication and would like to continue on this regimen. He says that he has experience some mild sexual side effects but he would like to continue medication and see if he improves. He says his anxiety today is 2/10 and depression is 2/10. He is sleeping 7-8 hours total per night.  He denies mania, no psychosis. No SI/HI   Prior psychiatric medication: Wellbutrin     Individual Medical History/ Review of Systems: Changes? :No   Allergies: Morphine and related and Latex  Current Medications:  Current Outpatient Medications:    clonazePAM (KLONOPIN) 0.5 MG tablet, Take 1 tablet (0.5 mg total) by mouth 2 (two) times daily as needed for anxiety., Disp: 15 tablet, Rfl: 1   HYDROcodone-acetaminophen (NORCO/VICODIN) 5-325 MG tablet, Take 1-2 tablets by mouth every 6 (six) hours as needed. (Patient not taking: Reported on 01/24/2022), Disp: 10 tablet, Rfl: 0   ondansetron (ZOFRAN ODT) 4 MG disintegrating tablet, Take 1 tablet (4 mg total) by mouth every 8 (eight) hours as needed for nausea or vomiting. (Patient not taking: Reported on 01/24/2022), Disp: 6 tablet, Rfl: 0   oxyCODONE-acetaminophen (PERCOCET) 5-325 MG tablet, Take 1-2 tablets by mouth every 6 (six) hours as needed. (Patient not taking: Reported on 01/24/2022), Disp: 15 tablet, Rfl: 0   phenazopyridine (PYRIDIUM) 200 MG tablet, Take 1 tablet (200 mg total) by mouth 3 (three) times daily as needed for pain. (Patient not taking: Reported on  01/24/2022), Disp: 10 tablet, Rfl: 0   prazosin (MINIPRESS) 2 MG capsule, Take 1-2 capsules at bedtime as needed for night terror., Disp: 60 capsule, Rfl: 3   tamsulosin (FLOMAX) 0.4 MG CAPS capsule, Take 1 capsule (0.4 mg total) by mouth daily. (Patient not taking: Reported on 01/24/2022), Disp: 14 capsule, Rfl: 0   Vilazodone HCl 20 MG TABS, Take 1 tablet (20 mg total) by mouth daily after breakfast., Disp: 30 tablet, Rfl: 3 Medication Side Effects: none  Family Medical/ Social History: Changes? No  MENTAL HEALTH EXAM:  There were no vitals taken for this visit.There is no height or weight on file to calculate BMI.  General Appearance: Casual, Neat, and Well Groomed  Eye Contact:  Good  Speech:  Clear and Coherent  Volume:  Normal  Mood:  NA  Affect:  Appropriate  Thought Process:  Coherent  Orientation:  Full (Time, Place, and Person)  Thought Content: Logical   Suicidal Thoughts:  No  Homicidal Thoughts:  No  Memory:  WNL  Judgement:  Good  Insight:  Good  Psychomotor Activity:  Normal  Concentration:  Concentration: Good  Recall:  Good  Fund of Knowledge: Good  Language: Good  Assets:  Desire for Improvement  ADL's:  Intact  Cognition: WNL  Prognosis:  Good    DIAGNOSES:    ICD-10-CM   1. Generalized anxiety disorder  F41.1 Vilazodone HCl 20 MG TABS    clonazePAM (KLONOPIN) 0.5 MG tablet    2. Major depressive disorder, recurrent episode, moderate (HCC)  F33.1  Vilazodone HCl 20 MG TABS    3. Night terror  F51.4 prazosin (MINIPRESS) 2 MG capsule      Receiving Psychotherapy: No    RECOMMENDATIONS:    Greater than 50% of 20 min face to face time with patient was spent on counseling and coordination of care. We discussed his moderate improvement with Viibryd. He says that he stopped Wellbutrin because when he was taking the two medications together, he started to vomit.  This has resolved and he is only taking Viibryd now.  He does not want to adjust his  medications at this time. 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  We agreed to; Continue Viibryd  20 mg daily. Start  Prazosin  2 mg -4 mg at bedtime for night terror.  To follow up in 6 weeks to reassess Provided emergency contact information Reviewed PDMP   Joan Flores, NP

## 2022-06-22 ENCOUNTER — Ambulatory Visit (INDEPENDENT_AMBULATORY_CARE_PROVIDER_SITE_OTHER): Payer: PRIVATE HEALTH INSURANCE | Admitting: Behavioral Health

## 2022-06-28 ENCOUNTER — Ambulatory Visit: Payer: PRIVATE HEALTH INSURANCE | Admitting: Behavioral Health

## 2022-11-11 IMAGING — CT CT RENAL STONE PROTOCOL
2 of 4 series · 15 of 46 positions shown, 17 images · non-contrast
Comparison: 06/15/2019

CLINICAL DATA: Flank pain.  Kidney stones suspected.

EXAM:
CT ABDOMEN AND PELVIS WITHOUT CONTRAST
TECHNIQUE: Multidetector CT imaging of the abdomen and pelvis was performed
following the standard protocol without IV contrast.

[Series 2: axial st · axial · 0.73mm/px · z∈[+904,+1359]mm · 12 of 105 slices shown, 14 images]
[im 7/105  soft-tissue]
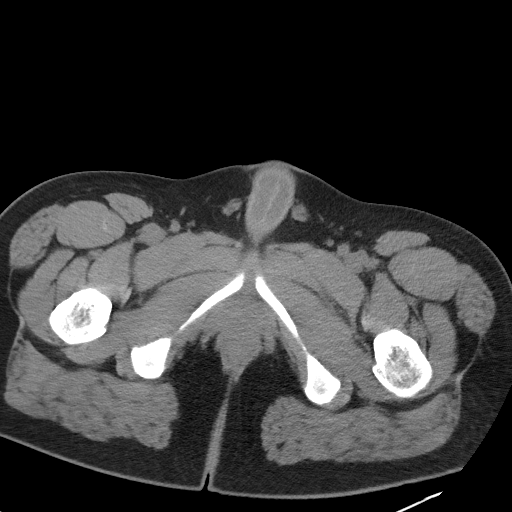
[im 7/105  bone]
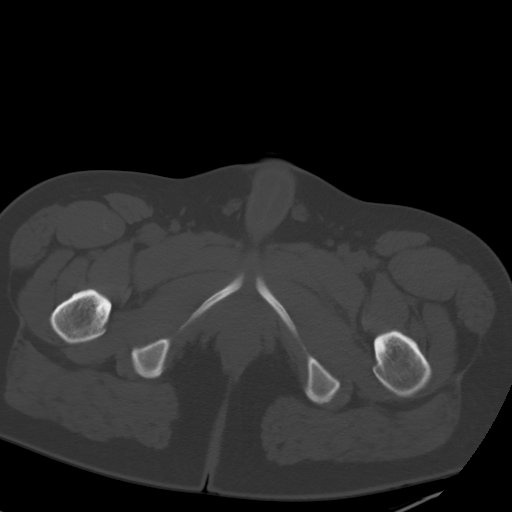
[im 19/105  soft-tissue]
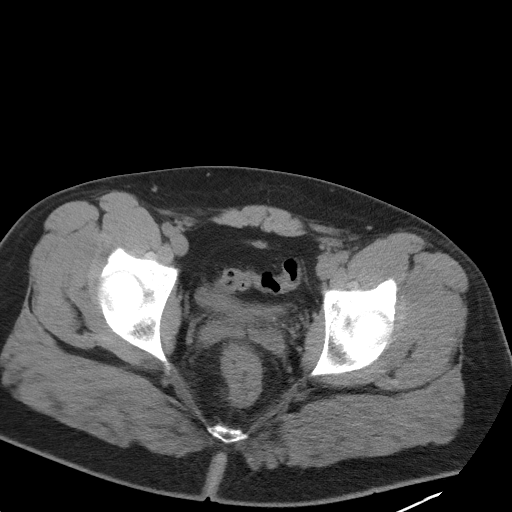
[im 25/105  soft-tissue]
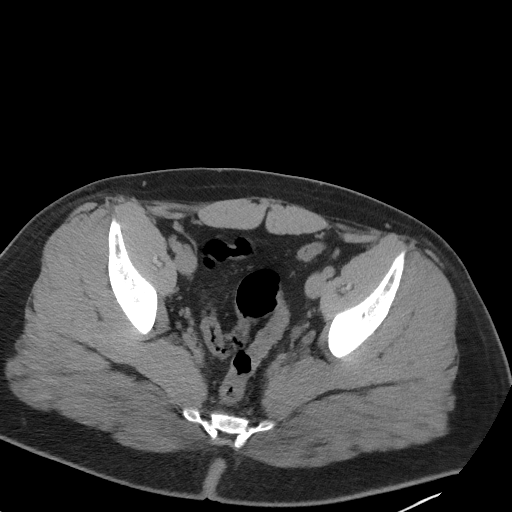
[im 31/105  soft-tissue]
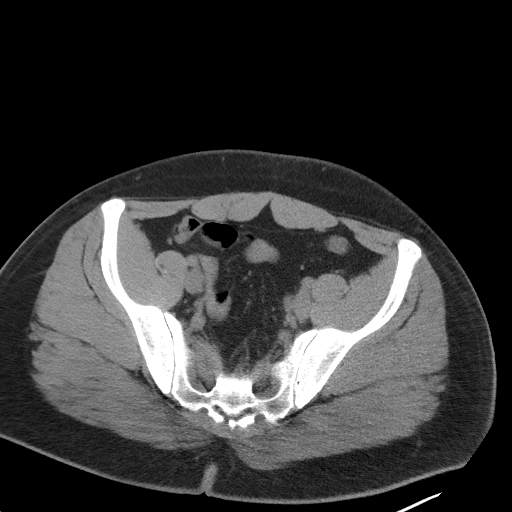
[im 43/105  soft-tissue]
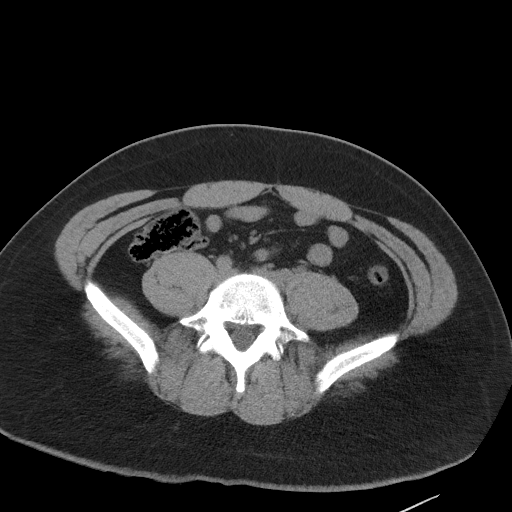
[im 49/105  soft-tissue]
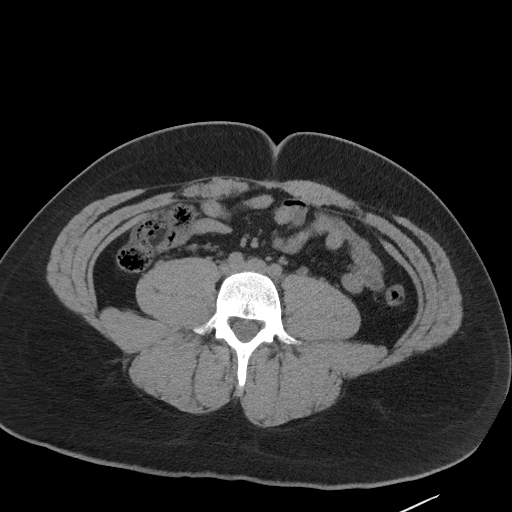
[im 56/105  soft-tissue]
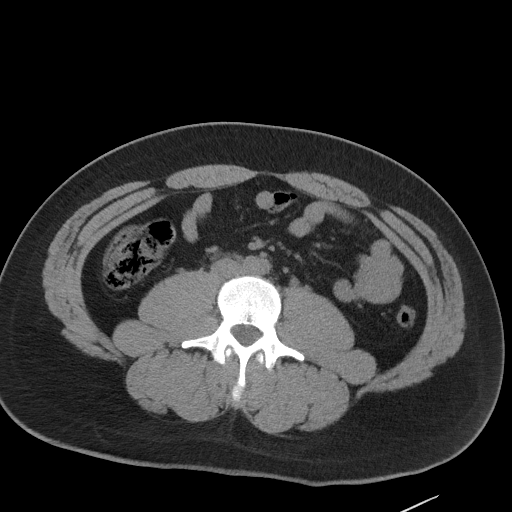
[im 68/105  soft-tissue]
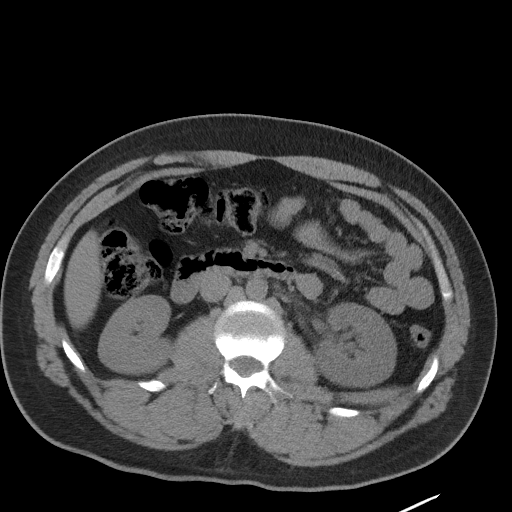
[im 74/105  soft-tissue]
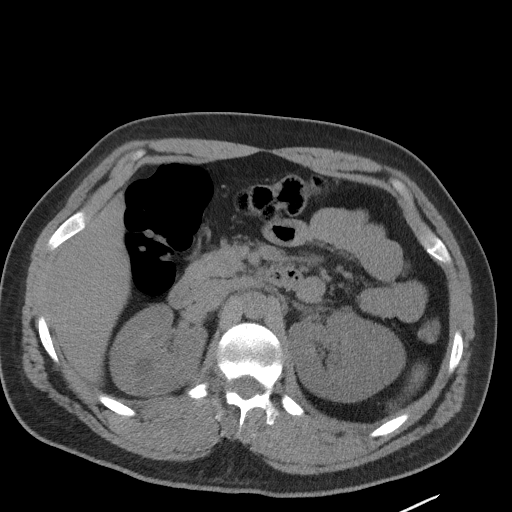
[im 74/105  bone]
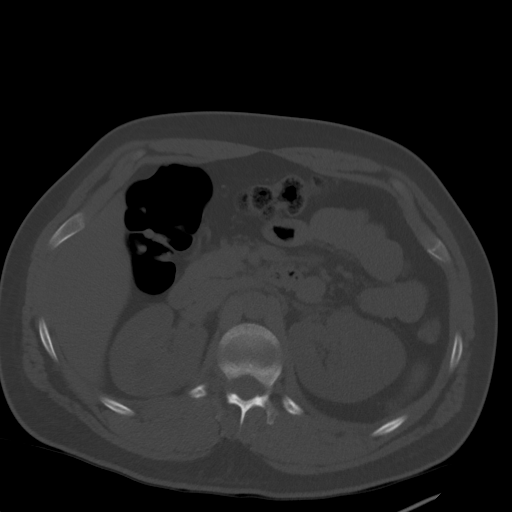
[im 80/105  soft-tissue]
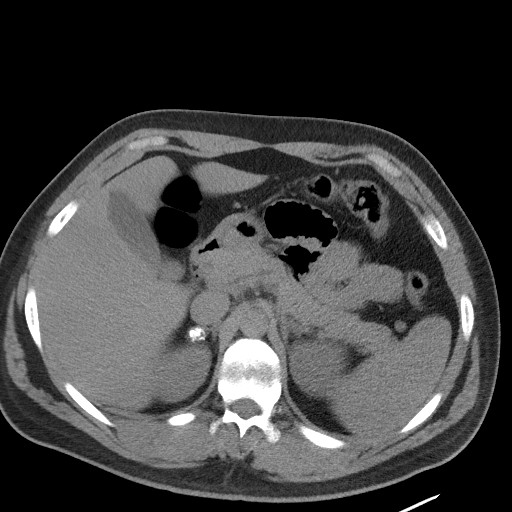
[im 92/105  soft-tissue]
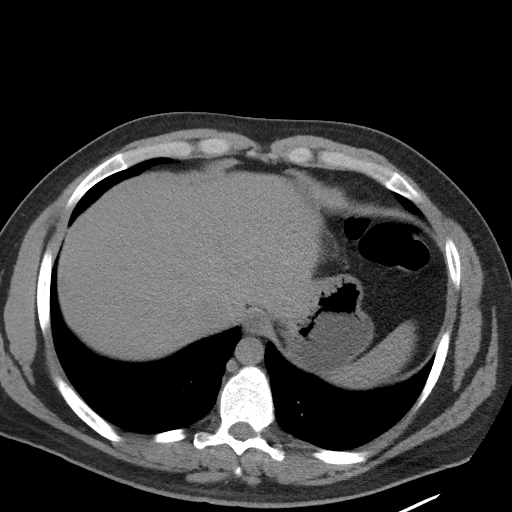
[im 98/105  soft-tissue]
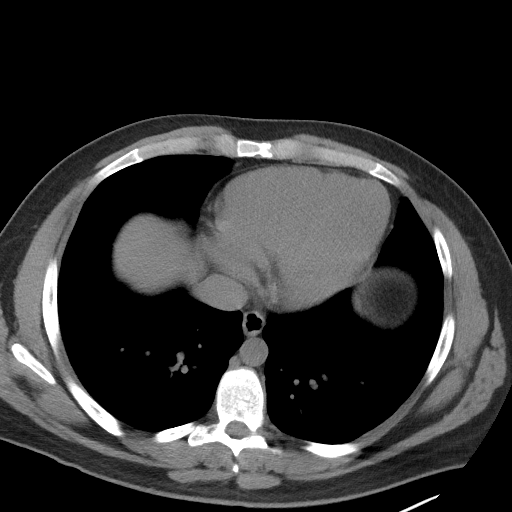

[Series 5: coronal st · coronal · 0.77mm/px · 3 of 84 slices shown]
[im 28/84  soft-tissue]
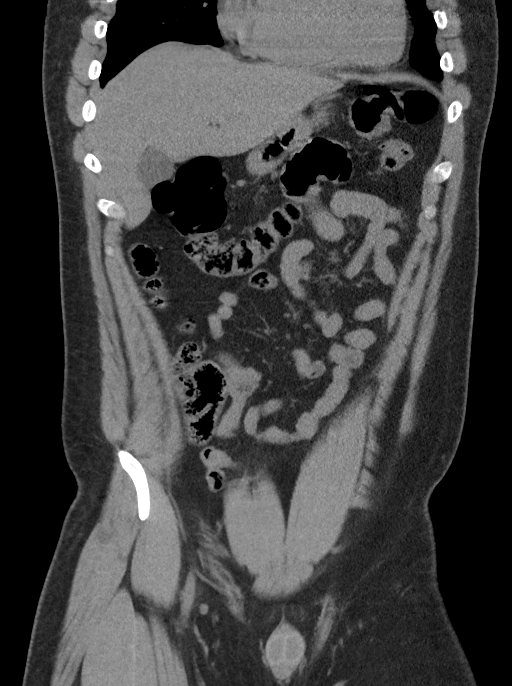
[im 37/84  soft-tissue]
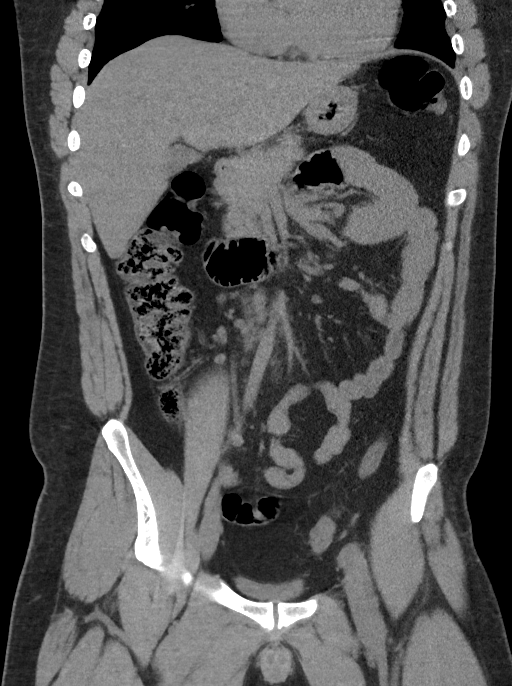
[im 47/84  soft-tissue]
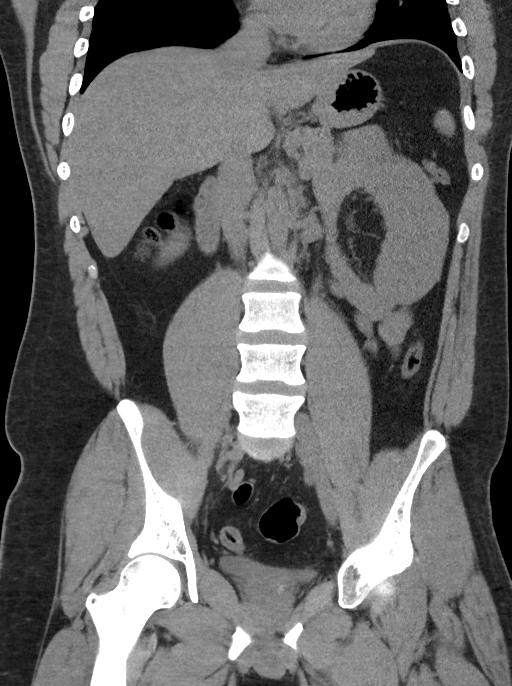

[15 of 46 positions shown; findings below may reference images not displayed]

FINDINGS: Lower chest: Unremarkable

Hepatobiliary: No focal abnormality in the liver on this study
without intravenous contrast. There is no evidence for gallstones,
gallbladder wall thickening, or pericholecystic fluid. No
intrahepatic or extrahepatic biliary dilation.

Pancreas: No focal mass lesion. No dilatation of the main duct. No
intraparenchymal cyst. No peripancreatic edema.

Spleen: No splenomegaly. No focal mass lesion.

Adrenals/Urinary Tract: Left adrenal gland unremarkable. Dystrophic
calcification in the right adrenal gland is stable, likely secondary
to prior infection/hemorrhage. 12 mm low-density lesion interpolar
right kidney measures water attenuation, likely a cyst. Right ureter
unremarkable. Left kidney appears edematous with subtle
edema/stranding around the left renal pelvis. There is mild left
hydroureter with mild periureteric edema. A punctate 1 mm stone is
identified at the left UVJ (axial 86/2 and coronal 52/5).

Bladder is decompressed without evidence for stones.

Stomach/Bowel: Stomach is unremarkable. No gastric wall thickening.
No evidence of outlet obstruction. Duodenum is normally positioned
as is the ligament of Treitz. No small bowel wall thickening. No
small bowel dilatation. The terminal ileum is normal. The appendix
is normal. No gross colonic mass. No colonic wall thickening.

Vascular/Lymphatic: No abdominal aortic aneurysm. No abdominal
aortic atherosclerotic calcification. There is no gastrohepatic or
hepatoduodenal ligament lymphadenopathy. No retroperitoneal or
mesenteric lymphadenopathy. No pelvic sidewall lymphadenopathy.

Reproductive: The prostate gland and seminal vesicles are
unremarkable.

Other: No intraperitoneal free fluid.

Musculoskeletal: No worrisome lytic or sclerotic osseous
abnormality.
IMPRESSION: 1. 1 mm left UVJ stone with mild left hydroureteronephrosis and
periureteric edema. No other urinary stone disease evident.
2. 12 mm low-density lesion interpolar right kidney, likely a cyst.

## 2023-08-01 ENCOUNTER — Encounter: Payer: Self-pay | Admitting: Behavioral Health

## 2023-08-01 ENCOUNTER — Ambulatory Visit: Payer: PRIVATE HEALTH INSURANCE | Admitting: Behavioral Health

## 2023-08-01 DIAGNOSIS — F411 Generalized anxiety disorder: Secondary | ICD-10-CM | POA: Diagnosis not present

## 2023-08-01 MED ORDER — ESCITALOPRAM OXALATE 10 MG PO TABS: 10.0000 mg | ORAL_TABLET | Freq: Every day | ORAL | 1 refills | Status: DC
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
## 2023-09-03 ENCOUNTER — Encounter: Payer: Self-pay | Admitting: Behavioral Health

## 2023-09-03 ENCOUNTER — Ambulatory Visit (INDEPENDENT_AMBULATORY_CARE_PROVIDER_SITE_OTHER): Payer: PRIVATE HEALTH INSURANCE | Admitting: Behavioral Health

## 2023-09-03 DIAGNOSIS — F331 Major depressive disorder, recurrent, moderate: Secondary | ICD-10-CM

## 2023-09-03 DIAGNOSIS — F411 Generalized anxiety disorder: Secondary | ICD-10-CM

## 2023-09-03 MED ORDER — BUPROPION HCL ER (XL) 300 MG PO TB24: 300.0000 mg | ORAL_TABLET | Freq: Every day | ORAL | 2 refills | Status: DC

## 2023-09-03 MED ORDER — ESCITALOPRAM OXALATE 10 MG PO TABS: 10.0000 mg | ORAL_TABLET | Freq: Every day | ORAL | 2 refills | Status: DC

## 2023-09-03 NOTE — Progress Notes (Signed)
[facility]  Patient ID: PILOT ENGLES,  MRN: [id number]  PCP: Elfredia Nevins, MD  Date of Evaluation: 09/03/2023 Time spent:30 minutes  Chief Complaint:  Chief Complaint   Anxiety; Depression; Follow-up; Patient Education; Medication Refill     HISTORY/CURRENT STATUS: HPI 27 year old presents to this office for follow up and medication management. His anxiety has improved but says that he is still struggling with depression. He would like to go back on Wellbutrin today. No psychosocial changes this visit.   He says his anxiety today is 2/10 and depression is 2/10. He is sleeping 7-8 hours total per night.  He denies mania, no psychosis. No SI/HI   Prior psychiatric medication: Wellbutrin  Individual Medical History/ Review of Systems: Changes? :No   Allergies: Morphine and codeine and Latex  Current Medications:  Current Outpatient Medications:    buPROPion (WELLBUTRIN XL) 300 MG 24 hr tablet, Take 1 tablet (300 mg total) by mouth daily., Disp: 30 tablet, Rfl: 2   clonazePAM (KLONOPIN) 0.5 MG tablet, Take 1 tablet (0.5 mg total) by mouth 2 (two) times daily as needed for anxiety., Disp: 15 tablet, Rfl: 1   escitalopram (LEXAPRO) 10 MG tablet, Take 1 tablet (10 mg total) by mouth daily., Disp: 30 tablet, Rfl: 2   HYDROcodone-acetaminophen (NORCO/VICODIN) 5-325 MG tablet, Take 1-2 tablets by mouth every 6 (six) hours as needed. (Patient not taking: Reported on 01/24/2022), Disp: 10 tablet, Rfl: 0   ondansetron (ZOFRAN ODT) 4 MG disintegrating tablet, Take 1 tablet (4 mg total) by mouth every 8 (eight) hours as needed for nausea or vomiting. (Patient not taking: Reported on 01/24/2022), Disp: 6 tablet, Rfl: 0   oxyCODONE-acetaminophen (PERCOCET) 5-325 MG tablet, Take 1-2 tablets by mouth every 6 (six) hours as needed. (Patient not taking: Reported on 01/24/2022), Disp: 15 tablet, Rfl: 0   phenazopyridine (PYRIDIUM) 200 MG tablet, Take 1 tablet (200 mg total) by mouth 3  (three) times daily as needed for pain. (Patient not taking: Reported on 01/24/2022), Disp: 10 tablet, Rfl: 0   tamsulosin (FLOMAX) 0.4 MG CAPS capsule, Take 1 capsule (0.4 mg total) by mouth daily. (Patient not taking: Reported on 01/24/2022), Disp: 14 capsule, Rfl: 0 Medication Side Effects: none  Family Medical/ Social History: Changes? No  MENTAL HEALTH EXAM:  There were no vitals taken for this visit.There is no height or weight on file to calculate BMI.  General Appearance: Casual  Eye Contact:  Good  Speech:  Clear and Coherent  Volume:  Normal  Mood:  Anxious, Depressed, and Dysphoric  Affect:  Appropriate  Thought Process:  Coherent  Orientation:  Full (Time, Place, and Person)  Thought Content: Logical   Suicidal Thoughts:  No  Homicidal Thoughts:  No  Memory:  WNL  Judgement:  Good  Insight:  Good  Psychomotor Activity:  Normal  Concentration:  Concentration: Good  Recall:  Good  Fund of Knowledge: Good  Language: Good  Assets:  Desire for Improvement  ADL's:  Intact  Cognition: WNL  Prognosis:  Good    DIAGNOSES:    ICD-10-CM   1. Generalized anxiety disorder  F41.1 buPROPion (WELLBUTRIN XL) 300 MG 24 hr tablet    escitalopram (LEXAPRO) 10 MG tablet    2. Major depressive disorder, recurrent episode, moderate (HCC)  F33.1 buPROPion (WELLBUTRIN XL) 300 MG 24 hr tablet      Receiving Psychotherapy: No    RECOMMENDATIONS:   Greater than 50% of 30 min face to face time with  patient was spent on counseling and coordination of care. We discussed his desire to go back on Wellbutrin due to continued depression. He feels like the change of seasons and holidays has triggered more depression.  His anxiety levels have been reduced from Lexapro.  We agreed to; Restart Wellbutrin  150 mg XL for two weeks and then 300  mg XL  daily.  To start Lexapro 10 mg daily after breakfast. To follow up in 6 weeks to reassess Will report side effects or worsening symptoms  promptly Provided emergency contact information Reviewed PDMP         Joan Flores, NP

## 2023-10-30 ENCOUNTER — Ambulatory Visit (INDEPENDENT_AMBULATORY_CARE_PROVIDER_SITE_OTHER): Payer: PRIVATE HEALTH INSURANCE | Admitting: Behavioral Health

## 2023-10-30 ENCOUNTER — Encounter: Payer: Self-pay | Admitting: Behavioral Health

## 2023-10-30 DIAGNOSIS — F331 Major depressive disorder, recurrent, moderate: Secondary | ICD-10-CM | POA: Diagnosis not present

## 2023-10-30 DIAGNOSIS — F411 Generalized anxiety disorder: Secondary | ICD-10-CM

## 2023-10-30 MED ORDER — ESCITALOPRAM OXALATE 10 MG PO TABS: 10.0000 mg | ORAL_TABLET | Freq: Every day | ORAL | 1 refills | Status: DC

## 2023-10-30 MED ORDER — BUPROPION HCL ER (XL) 300 MG PO TB24: 300.0000 mg | ORAL_TABLET | Freq: Every day | ORAL | 1 refills | Status: DC

## 2023-10-30 NOTE — Progress Notes (Signed)
 [facility]  Patient ID: Robert Rios,  MRN: [id number]  PCP: Bertell Satterfield, MD  Date of Evaluation: 10/30/2023 Time spent:30 minutes  Chief Complaint:  Chief Complaint   Anxiety; Depression; Follow-up; Medication Refill; Patient Education     HISTORY/CURRENT STATUS: HPI 28 year old presents to this office for follow up and medication management. Anxiety and depression levels have improved. He understands that his excessive consumption of energy drinks may cause increased anxiety or panic.  Woke up one night with palpitations.  Says he will be trying to cut back on the energy drinks this month. Follow up with PCP regularly.  He would like to go back on Wellbutrin  today. No psychosocial changes this visit.   He says his anxiety today is 2/10 and depression is 2/10. He is sleeping 7-8 hours total per night.  He denies mania, no psychosis. No SI/HI   Prior psychiatric medication: Wellbutrin   Individual Medical History/ Review of Systems: Changes? :No   Allergies: Morphine  and codeine and Latex  Current Medications:  Current Outpatient Medications:    buPROPion  (WELLBUTRIN  XL) 300 MG 24 hr tablet, Take 1 tablet (300 mg total) by mouth daily., Disp: 90 tablet, Rfl: 1   clonazePAM  (KLONOPIN ) 0.5 MG tablet, Take 1 tablet (0.5 mg total) by mouth 2 (two) times daily as needed for anxiety., Disp: 15 tablet, Rfl: 1   escitalopram  (LEXAPRO ) 10 MG tablet, Take 1 tablet (10 mg total) by mouth daily., Disp: 90 tablet, Rfl: 1   HYDROcodone -acetaminophen  (NORCO/VICODIN) 5-325 MG tablet, Take 1-2 tablets by mouth every 6 (six) hours as needed. (Patient not taking: Reported on 01/24/2022), Disp: 10 tablet, Rfl: 0   ondansetron  (ZOFRAN  ODT) 4 MG disintegrating tablet, Take 1 tablet (4 mg total) by mouth every 8 (eight) hours as needed for nausea or vomiting. (Patient not taking: Reported on 01/24/2022), Disp: 6 tablet, Rfl: 0   oxyCODONE -acetaminophen  (PERCOCET) 5-325 MG tablet, Take 1-2  tablets by mouth every 6 (six) hours as needed. (Patient not taking: Reported on 01/24/2022), Disp: 15 tablet, Rfl: 0   phenazopyridine  (PYRIDIUM ) 200 MG tablet, Take 1 tablet (200 mg total) by mouth 3 (three) times daily as needed for pain. (Patient not taking: Reported on 01/24/2022), Disp: 10 tablet, Rfl: 0   tamsulosin  (FLOMAX ) 0.4 MG CAPS capsule, Take 1 capsule (0.4 mg total) by mouth daily. (Patient not taking: Reported on 01/24/2022), Disp: 14 capsule, Rfl: 0 Medication Side Effects: none  Family Medical/ Social History: Changes? No  MENTAL HEALTH EXAM:  There were no vitals taken for this visit.There is no height or weight on file to calculate BMI.  General Appearance: Casual and Neat  Eye Contact:  NA  Speech:  Clear and Coherent  Volume:  Normal  Mood:  NA  Affect:  Appropriate  Thought Process:  Coherent  Orientation:  Full (Time, Place, and Person)  Thought Content: Logical   Suicidal Thoughts:  No  Homicidal Thoughts:  No  Memory:  WNL  Judgement:  Good  Insight:  Good  Psychomotor Activity:  Normal  Concentration:  Concentration: Good  Recall:  Good  Fund of Knowledge: Good  Language: Good  Assets:  Desire for Improvement  ADL's:  Intact  Cognition: WNL  Prognosis:  Good    DIAGNOSES:    ICD-10-CM   1. Generalized anxiety disorder  F41.1 buPROPion  (WELLBUTRIN  XL) 300 MG 24 hr tablet    escitalopram  (LEXAPRO ) 10 MG tablet    2. Major depressive disorder, recurrent episode, moderate (HCC)  F33.1 buPROPion  (WELLBUTRIN  XL) 300 MG 24 hr tablet      Receiving Psychotherapy: No    RECOMMENDATIONS:   Greater than 50% of 30 min face to face time with patient was spent on counseling and coordination of care. We discussed his desire to go back on Wellbutrin  due to continued depression. He feels like the change of seasons and holidays has triggered more depression.  His anxiety levels have been reduced from Lexapro .  We agreed to; Continue Wellbutrin  300 mg XL  daily Continue  Lexapro  10 mg daily after breakfast. To follow up in 12 weeks to reassess Will report side effects or worsening symptoms promptly Provided emergency contact information Reviewed PDMP  Redell DELENA Pizza, NP

## 2024-01-28 ENCOUNTER — Encounter: Payer: Self-pay | Admitting: Behavioral Health

## 2024-01-28 ENCOUNTER — Telehealth: Payer: PRIVATE HEALTH INSURANCE | Admitting: Behavioral Health

## 2024-01-28 DIAGNOSIS — F411 Generalized anxiety disorder: Secondary | ICD-10-CM | POA: Diagnosis not present

## 2024-01-28 DIAGNOSIS — F331 Major depressive disorder, recurrent, moderate: Secondary | ICD-10-CM

## 2024-01-28 DIAGNOSIS — F514 Sleep terrors [night terrors]: Secondary | ICD-10-CM

## 2024-01-28 MED ORDER — ESCITALOPRAM OXALATE 10 MG PO TABS: 10.0000 mg | ORAL_TABLET | Freq: Every day | ORAL | 1 refills | Status: AC

## 2024-01-28 MED ORDER — BUPROPION HCL ER (XL) 300 MG PO TB24: 300.0000 mg | ORAL_TABLET | Freq: Every day | ORAL | 1 refills | Status: AC

## 2024-01-28 NOTE — Progress Notes (Signed)
 Robert Rios [id number] June 12, 1996 28 y.o.  Virtual Visit via Video Note  I connected with pt @ on 01/28/24 at  8:30 AM EDT by a video enabled telemedicine application and verified that I am speaking with the correct person using two identifiers.   I discussed the limitations of evaluation and management by telemedicine and the availability of in person appointments. The patient expressed understanding and agreed to proceed.  I discussed the assessment and treatment plan with the patient. The patient was provided an opportunity to ask questions and all were answered. The patient agreed with the plan and demonstrated an understanding of the instructions.   The patient was advised to call back or seek an in-person evaluation if the symptoms worsen or if the condition fails to improve as anticipated.  I provided 20 minutes of non-face-to-face time during this encounter.  The patient was located at home.  The provider was located at [facility].   Robert Renshaw, NP   Subjective:   Patient ID:  Robert Rios is a 28 y.o. (DOB 01-04-1996) male.  Chief Complaint:  Chief Complaint  Patient presents with   Depression   Anxiety   Follow-up   Medication Refill   Patient Education    HPI 28 year old presents to this office for follow up and medication management. Anxiety and depression levels have improved significantly. Reports work and personal life has been good. Says he cut back on energy drinks and palpitations have subsided.  Follow up with PCP regularly.  No psychosocial changes this visit.   He says his anxiety today is 2/10 and depression is 2/10. He is sleeping 7-8 hours total per night.  He denies mania, no psychosis. No SI/HI   Prior psychiatric medication: Wellbutrin    Review of Systems:  Review of Systems  Constitutional: Negative.   Allergic/Immunologic: Negative.   Psychiatric/Behavioral: Negative.      Medications: I have reviewed the patient's current  medications.  Current Outpatient Medications  Medication Sig Dispense Refill   buPROPion (WELLBUTRIN XL) 300 MG 24 hr tablet Take 1 tablet (300 mg total) by mouth daily. 90 tablet 1   clonazePAM (KLONOPIN) 0.5 MG tablet Take 1 tablet (0.5 mg total) by mouth 2 (two) times daily as needed for anxiety. 15 tablet 1   escitalopram (LEXAPRO) 10 MG tablet Take 1 tablet (10 mg total) by mouth daily. 90 tablet 1   HYDROcodone-acetaminophen (NORCO/VICODIN) 5-325 MG tablet Take 1-2 tablets by mouth every 6 (six) hours as needed. (Patient not taking: Reported on 01/24/2022) 10 tablet 0   ondansetron (ZOFRAN ODT) 4 MG disintegrating tablet Take 1 tablet (4 mg total) by mouth every 8 (eight) hours as needed for nausea or vomiting. (Patient not taking: Reported on 01/24/2022) 6 tablet 0   oxyCODONE-acetaminophen (PERCOCET) 5-325 MG tablet Take 1-2 tablets by mouth every 6 (six) hours as needed. (Patient not taking: Reported on 01/24/2022) 15 tablet 0   phenazopyridine (PYRIDIUM) 200 MG tablet Take 1 tablet (200 mg total) by mouth 3 (three) times daily as needed for pain. (Patient not taking: Reported on 01/24/2022) 10 tablet 0   tamsulosin (FLOMAX) 0.4 MG CAPS capsule Take 1 capsule (0.4 mg total) by mouth daily. (Patient not taking: Reported on 01/24/2022) 14 capsule 0   No current facility-administered medications for this visit.    Medication Side Effects: None  Allergies:  Allergies  Allergen Reactions   Morphine And Codeine     Respiratory depression   Latex Rash  No past medical history on file.  Family History  Problem Relation Age of Onset   OCD Mother    Depression Mother    Anxiety disorder Mother    Bipolar disorder Mother    Alcohol abuse Maternal Aunt     Social History   Socioeconomic History   Marital status: Significant Other    Spouse name: [place]   Number of children: 2   Years of education: 13   Highest education level: Some college, no degree  Occupational History    Not on file  Tobacco Use   Smoking status: Never   Smokeless tobacco: Never  Substance and Sexual Activity   Alcohol use: No   Drug use: No   Sexual activity: Yes  Other Topics Concern   Not on file  Social History Narrative   Lives with girlfriend in [place] with 4 children.    Social Drivers of Corporate investment banker Strain: Not on file  Food Insecurity: Not on file  Transportation Needs: Not on file  Physical Activity: Not on file  Stress: Not on file  Social Connections: Not on file  Intimate Partner Violence: Not on file    Past Medical History, Surgical history, Social history, and Family history were reviewed and updated as appropriate.   Please see review of systems for further details on the patient's review from today.   Objective:   Physical Exam:  There were no vitals taken for this visit.  Physical Exam Constitutional:      General: He is not in acute distress.    Appearance: Normal appearance.  Neurological:     Mental Status: He is alert and oriented to person, place, and time.     Gait: Gait normal.  Psychiatric:        Attention and Perception: Attention and perception normal. He does not perceive auditory or visual hallucinations.        Mood and Affect: Mood and affect normal. Mood is not anxious or depressed. Affect is not labile.        Speech: Speech normal.        Behavior: Behavior normal. Behavior is cooperative.        Thought Content: Thought content normal.        Cognition and Memory: Cognition and memory normal.        Judgment: Judgment normal.     Lab Review:     Component Value Date/Time   NA 140 06/30/2021 0544   K 3.5 06/30/2021 0544   CL 104 06/30/2021 0544   CO2 28 06/30/2021 0544   GLUCOSE 118 (H) 06/30/2021 0544   BUN 14 06/30/2021 0544   CREATININE 1.21 06/30/2021 0544   CALCIUM 9.3 06/30/2021 0544   PROT 7.5 06/15/2019 1403   ALBUMIN 4.7 06/15/2019 1403   AST 28 06/15/2019 1403   ALT 43 06/15/2019 1403    ALKPHOS 55 06/15/2019 1403   BILITOT 0.6 06/15/2019 1403   GFRNONAA >60 06/30/2021 0544   GFRAA >60 06/15/2019 1403       Component Value Date/Time   WBC 8.2 06/30/2021 0544   RBC 4.95 06/30/2021 0544   HGB 15.1 06/30/2021 0544   HCT 43.4 06/30/2021 0544   PLT 219 06/30/2021 0544   MCV 87.7 06/30/2021 0544   MCH 30.5 06/30/2021 0544   MCHC 34.8 06/30/2021 0544   RDW 12.6 06/30/2021 0544   LYMPHSABS 1.8 06/30/2021 0544   MONOABS 0.5 06/30/2021 0544   EOSABS 0.1 06/30/2021 0544  BASOSABS 0.1 06/30/2021 0544    No results found for: "POCLITH", "LITHIUM"   No results found for: "PHENYTOIN", "PHENOBARB", "VALPROATE", "CBMZ"   .res Assessment: Plan:    Recommendations:  Greater than 50% of 20  min face video visit time with patient was spent on counseling and coordination of care. We discussed his report of doing exceptionally well since last visit. He is happy with his medication.  His anxiety levels have been reduced from Lexapro.  We agreed to; Continue Wellbutrin 300 mg XL daily Continue  Lexapro 10 mg daily after breakfast. To follow up in 12 weeks to reassess Will report side effects or worsening symptoms promptly Provided emergency contact information Reviewed PDMP   Robert Renshaw, NP            Sederick was seen today for depression, anxiety, follow-up, medication refill and patient education.  Diagnoses and all orders for this visit:  Generalized anxiety disorder  Major depressive disorder, recurrent episode, moderate (HCC)  Night terror     Please see After Visit Summary for patient specific instructions.  Future Appointments  Date Time Provider Department Center  02/15/2024 10:40 AM McKenzie, Arden Beck, MD [facility] None    No orders of the defined types were placed in this encounter.     -------------------------------

## 2024-02-15 ENCOUNTER — Ambulatory Visit: Admitting: Urology

## 2024-03-28 ENCOUNTER — Ambulatory Visit: Payer: PRIVATE HEALTH INSURANCE | Admitting: Urology

## 2024-03-28 ENCOUNTER — Encounter: Payer: Self-pay | Admitting: Urology

## 2024-03-28 VITALS — BP 133/76 | HR 60

## 2024-03-28 DIAGNOSIS — Z3009 Encounter for other general counseling and advice on contraception: Secondary | ICD-10-CM

## 2024-03-28 MED ORDER — DIAZEPAM 10 MG PO TABS: 10.0000 mg | ORAL_TABLET | Freq: Once | ORAL | 0 refills | Status: AC

## 2024-03-28 NOTE — Patient Instructions (Signed)
 Vasectomy Vasectomy is a procedure to cut and then tie or burn the ends of the vas deferens. The vas deferens is a tube that carries sperm from the testicle to the urethra. This procedure blocks sperm from being released during sex. This ensures that sperm does not go into the vagina. A vasectomy does not affect your ability to have sex or your desire for sex. Also, it does not prevent sexually transmitted infections, or STIs. Vasectomy is a permanent and effective form of birth control. You should have a vasectomy only when you and your partner are sure you do not want children in the future. Do not get this procedure when you are stressed, such as after divorce or pregnancy loss. Tell a health care provider about: Any allergies you have. All medicines you are taking. These include vitamins, herbs, eye drops, creams, and over-the-counter medicines. Any problems you or family members have had with anesthesia. Any bleeding problems you have. Any surgeries you have had. Any medical conditions you have. What are the risks? Your provider will talk with you about risks. These may include: Infection. Bleeding and swelling of the scrotum. The scrotum is the sac that contains the testicles. Allergies to medicines. Failure of the procedure to prevent pregnancy. There is a very small chance that the tied or burned parts of the vas deferens may reconnect. If this happens, you could still make a person pregnant. Pain in the scrotum that goes on after you heal from the procedure. What happens before the procedure? Medicines Ask your health care provider about: Changing or stopping your regular medicines. These include any diabetes medicines or blood thinners you take. Taking medicines such as aspirin and ibuprofen. These medicines can thin your blood. Do not take them unless your provider tells you to take them. Taking over-the-counter medicines, vitamins, herbs, and supplements. You may be told to take a  sedative a few hours before the procedure. A sedative helps you relax. Surgery safety Ask your provider: How your surgery site will be marked. What steps will be taken to help prevent infection. These steps may include: Removing hair at the surgery site. Washing skin with a soap that kills germs. Taking antibiotics. General instructions Do not use any products that contain nicotine or tobacco for at least 4 weeks before the procedure. These products include cigarettes, chewing tobacco, and vaping devices, such as e-cigarettes. If you need help quitting, ask your provider. If you'll be going home right after the procedure, plan to have a responsible adult: Take you home from the hospital or clinic. You'll not be allowed to drive. Care for you for the time you are told. What happens during the procedure?  You may be given: A sedative. This helps you relax. You may also be told to take this a few hours before the procedure. Anesthesia. This keeps you from feeling pain. It will numb certain areas of your body. Your provider will feel for your vas deferens. To get to the vas deferens, your provider may: Make a very small cut, or incision, in your scrotum. Make a hole by piercing the scrotum. Your vas deferens will be pulled out of your scrotum and cut. To close it, the cut ends of the vas deferens will be tied or burned. The vas deferens will be put back into your scrotum. The cut or the hole in the scrotum will be closed with stitches. The stitches will dissolve and will not need to be removed. The procedure will be done  again on the other side of your scrotum. The procedure may vary among providers and hospitals. What happens after the procedure? You will be monitored to make sure that you do not have problems. You will be asked not to ejaculate for at least 1 week after the procedure, or for as long as you are told. You will need to use another form of birth control for 2-4 months after  the procedure. Do this until your provider confirms that there's no sperm in your semen. You may be given something to wear to support your scrotum. This includes a jockstrap or underwear with a pouch. If you were given a sedative during your procedure, do not drive or use machines until your provider says that it's safe. This information is not intended to replace advice given to you by your health care provider. Make sure you discuss any questions you have with your health care provider. Document Revised: 12/05/2022 Document Reviewed: 12/05/2022 Elsevier Patient Education  2024 [employer].

## 2024-03-28 NOTE — Progress Notes (Signed)
 03/28/2024 8:58 AM   Robert Rios 04-Mar-1996 [id number]  Referring provider: Kathyleen Parkins, MD [address] [place],  [place] [address]  Desires sterilization   HPI: [name] is a 28yo here for evaluation of vasectomy. 2 healthy children. No scrotal surgeries. NO hx of UTIs, orchitis or prostatitis. NO issues with erections.    PMH: No past medical history on file.  Surgical History: Past Surgical History:  Procedure Laterality Date   CYSTOSCOPY WITH RETROGRADE PYELOGRAM, URETEROSCOPY AND STENT PLACEMENT Right 06/30/2019   Procedure: CYSTOSCOPY WITH RETROGRADE PYELOGRAM,RIGHT URETEROSCOPY, STENT PLACEMENT, BASKET STONE REMOVAL;  Surgeon: Homero Luster, MD;  Location: [facility];  Service: Urology;  Laterality: Right;   TONSILLECTOMY     WISDOM TOOTH EXTRACTION      Home Medications:  Allergies as of 03/28/2024       Reactions   Morphine  And Codeine    Respiratory depression   Latex Rash        Medication List        Accurate as of March 28, 2024  8:58 AM. If you have any questions, ask your nurse or doctor.          buPROPion  300 MG 24 hr tablet Commonly known as: Wellbutrin  XL Take 1 tablet (300 mg total) by mouth daily.   clonazePAM  0.5 MG tablet Commonly known as: KLONOPIN  Take 1 tablet (0.5 mg total) by mouth 2 (two) times daily as needed for anxiety.   escitalopram  10 MG tablet Commonly known as: Lexapro  Take 1 tablet (10 mg total) by mouth daily.   HYDROcodone -acetaminophen  5-325 MG tablet Commonly known as: NORCO/VICODIN Take 1-2 tablets by mouth every 6 (six) hours as needed.   ondansetron  4 MG disintegrating tablet Commonly known as: Zofran  ODT Take 1 tablet (4 mg total) by mouth every 8 (eight) hours as needed for nausea or vomiting.   oxyCODONE -acetaminophen  5-325 MG tablet Commonly known as: Percocet Take 1-2 tablets by mouth every 6 (six) hours as needed.   phenazopyridine  200 MG tablet Commonly known as: Pyridium  Take 1 tablet  (200 mg total) by mouth 3 (three) times daily as needed for pain.   tamsulosin  0.4 MG Caps capsule Commonly known as: Flomax  Take 1 capsule (0.4 mg total) by mouth daily.        Allergies:  Allergies  Allergen Reactions   Morphine  And Codeine     Respiratory depression   Latex Rash    Family History: Family History  Problem Relation Age of Onset   OCD Mother    Depression Mother    Anxiety disorder Mother    Bipolar disorder Mother    Alcohol abuse Maternal Aunt     Social History:  reports that he has never smoked. He has never used smokeless tobacco. He reports that he does not drink alcohol and does not use drugs.  ROS: All other review of systems were reviewed and are negative except what is noted above in HPI  Physical Exam: BP 133/76   Pulse 60   Constitutional:  Alert and oriented, No acute distress. HEENT: [place] AT, moist mucus membranes.  Trachea midline, no masses. Cardiovascular: No clubbing, cyanosis, or edema. Respiratory: Normal respiratory effort, no increased work of breathing. GI: Abdomen is soft, nontender, nondistended, no abdominal masses GU: No CVA tenderness. Circumcised phallus. No masses/lesions on penis, testis, scrotum.  Bilateral vas deferens palpable  Lymph: No cervical or inguinal lymphadenopathy. Skin: No rashes, bruises or suspicious lesions. Neurologic: Grossly intact, no focal deficits, moving  all 4 extremities. Psychiatric: Normal mood and affect.  Laboratory Data: Lab Results  Component Value Date   WBC 8.2 06/30/2021   HGB 15.1 06/30/2021   HCT 43.4 06/30/2021   MCV 87.7 06/30/2021   PLT 219 06/30/2021    Lab Results  Component Value Date   CREATININE 1.21 06/30/2021    No results found for: PSA  No results found for: TESTOSTERONE  No results found for: HGBA1C  Urinalysis    Component Value Date/Time   COLORURINE YELLOW 06/15/2019 1531   APPEARANCEUR CLOUDY (A) 06/15/2019 1531   LABSPEC 1.028 06/15/2019  1531   PHURINE 5.0 06/15/2019 1531   GLUCOSEU NEGATIVE 06/15/2019 1531   HGBUR LARGE (A) 06/15/2019 1531   BILIRUBINUR NEGATIVE 06/15/2019 1531   KETONESUR NEGATIVE 06/15/2019 1531   PROTEINUR 30 (A) 06/15/2019 1531   NITRITE NEGATIVE 06/15/2019 1531   LEUKOCYTESUR NEGATIVE 06/15/2019 1531    Lab Results  Component Value Date   BACTERIA NONE SEEN 06/15/2019    Pertinent Imaging:  No results found for this or any previous visit.  No results found for this or any previous visit.  No results found for this or any previous visit.  No results found for this or any previous visit.  No results found for this or any previous visit.  No results found for this or any previous visit.  No results found for this or any previous visit.  Results for orders placed during the hospital encounter of 06/30/21  CT Renal Stone Study  Narrative CLINICAL DATA:  Flank pain.  Kidney stones suspected.  EXAM: CT ABDOMEN AND PELVIS WITHOUT CONTRAST  TECHNIQUE: Multidetector CT imaging of the abdomen and pelvis was performed following the standard protocol without IV contrast.  COMPARISON:  06/15/2019  FINDINGS: Lower chest: Unremarkable  Hepatobiliary: No focal abnormality in the liver on this study without intravenous contrast. There is no evidence for gallstones, gallbladder wall thickening, or pericholecystic fluid. No intrahepatic or extrahepatic biliary dilation.  Pancreas: No focal mass lesion. No dilatation of the main duct. No intraparenchymal cyst. No peripancreatic edema.  Spleen: No splenomegaly. No focal mass lesion.  Adrenals/Urinary Tract: Left adrenal gland unremarkable. Dystrophic calcification in the right adrenal gland is stable, likely secondary to prior infection/hemorrhage. 12 mm low-density lesion interpolar right kidney measures water attenuation, likely a cyst. Right ureter unremarkable. Left kidney appears edematous with subtle edema/stranding around the  left renal pelvis. There is mild left hydroureter with mild periureteric edema. A punctate 1 mm stone is identified at the left UVJ (axial 86/2 and coronal 52/5).  Bladder is decompressed without evidence for stones.  Stomach/Bowel: Stomach is unremarkable. No gastric wall thickening. No evidence of outlet obstruction. Duodenum is normally positioned as is the ligament of Treitz. No small bowel wall thickening. No small bowel dilatation. The terminal ileum is normal. The appendix is normal. No gross colonic mass. No colonic wall thickening.  Vascular/Lymphatic: No abdominal aortic aneurysm. No abdominal aortic atherosclerotic calcification. There is no gastrohepatic or hepatoduodenal ligament lymphadenopathy. No retroperitoneal or mesenteric lymphadenopathy. No pelvic sidewall lymphadenopathy.  Reproductive: The prostate gland and seminal vesicles are unremarkable.  Other: No intraperitoneal free fluid.  Musculoskeletal: No worrisome lytic or sclerotic osseous abnormality.  IMPRESSION: 1. 1 mm left UVJ stone with mild left hydroureteronephrosis and periureteric edema. No other urinary stone disease evident. 2. 12 mm low-density lesion interpolar right kidney, likely a cyst.   Electronically Signed By: Donnal Fusi M.D. On: 06/30/2021 07:04   Assessment & Plan:  1. Vasectomy evaluation (Primary) Schedule for vasectomy -rx for valium sent to pharmacy   No follow-ups on file.  Johnie Nailer, MD  [facility] [place]

## 2024-06-13 ENCOUNTER — Encounter: Payer: PRIVATE HEALTH INSURANCE | Admitting: Urology

## 2024-10-13 ENCOUNTER — Telehealth: Payer: Self-pay

## 2024-10-13 NOTE — Telephone Encounter (Signed)
 Patient needing the vallum called into pharmacy for upcoming Vasectomy.  Pharmacy:  [facility] [address], [place] - [address] Phone: [phone]  Fax: [phone]

## 2024-10-13 NOTE — Telephone Encounter (Signed)
 Message sent to provider for Rx to be filled

## 2024-10-15 ENCOUNTER — Other Ambulatory Visit: Payer: Self-pay | Admitting: Urology

## 2024-10-15 ENCOUNTER — Ambulatory Visit (INDEPENDENT_AMBULATORY_CARE_PROVIDER_SITE_OTHER): Payer: PRIVATE HEALTH INSURANCE | Admitting: Urology

## 2024-10-15 VITALS — BP 131/88 | HR 93

## 2024-10-15 DIAGNOSIS — Z3009 Encounter for other general counseling and advice on contraception: Secondary | ICD-10-CM

## 2024-10-15 DIAGNOSIS — Z302 Encounter for sterilization: Secondary | ICD-10-CM | POA: Diagnosis not present

## 2024-10-15 MED ORDER — DIAZEPAM 10 MG PO TABS: 10.0000 mg | ORAL_TABLET | Freq: Once | ORAL | 0 refills | Status: AC

## 2024-10-15 NOTE — Progress Notes (Signed)
 10/15/2024  CC: desires sterilization   HPI:  Blood pressure 131/88, pulse 93. NED. A&Ox3.   No respiratory distress   Abd soft, NT, ND Normal external genitalia with patent urethral meatus  A timeout was performed.  Patient's identity and consent was confirmed.  All questions were answered.   Bilateral Vasectomy Procedure  Pre-Procedure: - Patient's scrotum was prepped and draped for vasectomy. - The vas was palpated through the scrotal skin on the left. - 1% Xylocaine  was injected into the skin and surrounding tissue for placement  - In a similar manner, the vas on the right was identified, anesthetized, and stabilized.  Procedure: - A sharp hemostat was used to make a small stab incision in the skin overlying the vas - The left vas was isolated and brought up through the incision exposing that structure. - Bleeding points were cauterized as they occurred. - The vas was free from the surrounding structures and brought to the view. - A segment was positioned for placement with a hemostat. - A second hemostat was placed and a small segment between the two hemostats and was removed for inspection. - Each end of the transected vas lumen was fulgurated/ obliterated using needlepoint electrocautery -A fascial interposition was performed on testicular end of the vas using #3-0 chromic suture -The same procedure was performed on the right. - A single suture of #3-0 chromic catgut was used to close each lateral scrotal skin incision - A dressing was applied.  Post-Procedure: - Patient was instructed in care of the operative area - A specimen is to be delivered in 12 weeks   -Another form of contraception is to be used until post vasectomy semen analysis  Belvie Clara, MD

## 2024-10-15 NOTE — Patient Instructions (Signed)
 Vasectomy Postoperative Instructions  Please bring back a semen analysis in approximately 3 months.  These are scheduled by appointment only.  PLEASE NOTE THE LOCATION BELOW  Your semen analysis  will need to be taken to:  [address] [place], [place] [address] Phone Number: [phone] Hours of scheduling: Monday, Tuesday and Wednesday 10 a.m.- 12 p.m. or 1 p.m.- 3 p.m. [facility] will provide collection instructions at the time of scheduling your appointment   You will be given a sterile specimen cup. Please label the cup with your name, date of birth, date and time of collections.  What to Expect  - slight redness, swelling and scant drainage along the incision  - mild to moderate discomfort  - black and blue (bruising) as the tissue heals  - low grade fever  - scrotal sensitivity and/or tenderness - Edges of the incision may pull apart and heal slowly, sometimes a knot may be present which remains for several months.  This is NORMAL and all part of the healing process. - if stitches are placed, they do not need to be removed - if you have pain or discomfort immediately after the vasectomy, you may use OTC pain medication for relief , ex: tylenol.  After local anesthetic wears off an ice pack will provide additional comfort and can also prevent swelling if used  Activity  - no sexual intercourse for at lease 5 days depending on comfort  - no heavy lifting for 48-72 hours (anything over 5-10 lbs)  Wound Care  - shower only after 24 hours  - no tub baths, hot tub, or pools for at least 7 days  - ice packs for 48 hours: 30 minutes on and 30 minutes off  Problem to Report  - generalized redness  - increased pain and swelling  - fever greater than 101 F  - significant drainage or bleeding from the wound  TO DO - Ejaculations help to clear the passage of sperm, but you must use another from of birth control until you are told you may discontinue its use!! - You will be given a  specimen cup to bring back a semen sample in 3 months to check and see if its clear of sperm.  Only after the semen is sent for analysis and is reported back as clear should you use this as your primary form of birth control!

## 2024-10-20 ENCOUNTER — Encounter: Payer: Self-pay | Admitting: Urology
